# Patient Record
Sex: Male | Born: 1940 | Race: White | Hispanic: No | Marital: Married | Smoking: Former smoker
Health system: Southern US, Community
[De-identification: ages and names within clinical notes are randomized; demographics above are authoritative.]

## PROBLEM LIST (undated history)

## (undated) DIAGNOSIS — D509 Iron deficiency anemia, unspecified: Secondary | ICD-10-CM

## (undated) DIAGNOSIS — E785 Hyperlipidemia, unspecified: Secondary | ICD-10-CM

## (undated) DIAGNOSIS — J449 Chronic obstructive pulmonary disease, unspecified: Secondary | ICD-10-CM

## (undated) DIAGNOSIS — E119 Type 2 diabetes mellitus without complications: Secondary | ICD-10-CM

## (undated) DIAGNOSIS — N289 Disorder of kidney and ureter, unspecified: Secondary | ICD-10-CM

## (undated) DIAGNOSIS — Z77090 Contact with and (suspected) exposure to asbestos: Secondary | ICD-10-CM

## (undated) DIAGNOSIS — I10 Essential (primary) hypertension: Secondary | ICD-10-CM

## (undated) DIAGNOSIS — N529 Male erectile dysfunction, unspecified: Secondary | ICD-10-CM

## (undated) DIAGNOSIS — G4733 Obstructive sleep apnea (adult) (pediatric): Secondary | ICD-10-CM

## (undated) DIAGNOSIS — M0609 Rheumatoid arthritis without rheumatoid factor, multiple sites: Secondary | ICD-10-CM

## (undated) DIAGNOSIS — M1990 Unspecified osteoarthritis, unspecified site: Secondary | ICD-10-CM

## (undated) HISTORY — DX: Hyperlipidemia, unspecified: E78.5

## (undated) HISTORY — DX: Contact with and (suspected) exposure to asbestos: Z77.090

## (undated) HISTORY — PX: CYSTECTOMY: SUR359

## (undated) HISTORY — DX: Male erectile dysfunction, unspecified: N52.9

## (undated) HISTORY — PX: APPENDECTOMY: SHX54

## (undated) HISTORY — PX: HERNIA REPAIR: SHX51

## (undated) HISTORY — PX: UPPER GI ENDOSCOPY: SHX6162

## (undated) HISTORY — DX: Obstructive sleep apnea (adult) (pediatric): G47.33

## (undated) HISTORY — DX: Chronic obstructive pulmonary disease, unspecified: J44.9

## (undated) HISTORY — PX: KNEE ARTHROSCOPY: SUR90

## (undated) HISTORY — PX: MANDIBLE FRACTURE SURGERY: SHX706

## (undated) HISTORY — DX: Rheumatoid arthritis without rheumatoid factor, multiple sites: M06.09

## (undated) HISTORY — DX: Iron deficiency anemia, unspecified: D50.9

## (undated) HISTORY — PX: COLONOSCOPY: SHX174

---

## 2006-01-10 HISTORY — PX: JOINT REPLACEMENT: SHX530

## 2010-07-23 DIAGNOSIS — E785 Hyperlipidemia, unspecified: Secondary | ICD-10-CM | POA: Insufficient documentation

## 2010-07-23 DIAGNOSIS — N529 Male erectile dysfunction, unspecified: Secondary | ICD-10-CM | POA: Insufficient documentation

## 2010-07-23 DIAGNOSIS — E119 Type 2 diabetes mellitus without complications: Secondary | ICD-10-CM | POA: Insufficient documentation

## 2010-07-23 DIAGNOSIS — E669 Obesity, unspecified: Secondary | ICD-10-CM | POA: Insufficient documentation

## 2010-07-23 DIAGNOSIS — I10 Essential (primary) hypertension: Secondary | ICD-10-CM | POA: Insufficient documentation

## 2010-07-23 DIAGNOSIS — M0609 Rheumatoid arthritis without rheumatoid factor, multiple sites: Secondary | ICD-10-CM | POA: Insufficient documentation

## 2016-05-26 DIAGNOSIS — Z77090 Contact with and (suspected) exposure to asbestos: Secondary | ICD-10-CM | POA: Insufficient documentation

## 2016-11-24 DIAGNOSIS — J449 Chronic obstructive pulmonary disease, unspecified: Secondary | ICD-10-CM | POA: Insufficient documentation

## 2016-11-24 DIAGNOSIS — J948 Other specified pleural conditions: Secondary | ICD-10-CM | POA: Insufficient documentation

## 2017-06-01 ENCOUNTER — Other Ambulatory Visit: Payer: Self-pay

## 2017-06-01 ENCOUNTER — Emergency Department: Payer: Medicare Other

## 2017-06-01 ENCOUNTER — Emergency Department
Admission: EM | Admit: 2017-06-01 | Discharge: 2017-06-01 | Disposition: A | Discharge status: Home / Self Care | Payer: Medicare Other | Attending: Emergency Medicine | Admitting: Emergency Medicine

## 2017-06-01 DIAGNOSIS — Z794 Long term (current) use of insulin: Secondary | ICD-10-CM | POA: Insufficient documentation

## 2017-06-01 DIAGNOSIS — Z79899 Other long term (current) drug therapy: Secondary | ICD-10-CM | POA: Insufficient documentation

## 2017-06-01 DIAGNOSIS — M25551 Pain in right hip: Secondary | ICD-10-CM

## 2017-06-01 DIAGNOSIS — Z87891 Personal history of nicotine dependence: Secondary | ICD-10-CM | POA: Diagnosis not present

## 2017-06-01 DIAGNOSIS — M79661 Pain in right lower leg: Secondary | ICD-10-CM

## 2017-06-01 DIAGNOSIS — I10 Essential (primary) hypertension: Secondary | ICD-10-CM | POA: Insufficient documentation

## 2017-06-01 DIAGNOSIS — E119 Type 2 diabetes mellitus without complications: Secondary | ICD-10-CM | POA: Diagnosis not present

## 2017-06-01 DIAGNOSIS — Z7982 Long term (current) use of aspirin: Secondary | ICD-10-CM | POA: Diagnosis not present

## 2017-06-01 HISTORY — DX: Type 2 diabetes mellitus without complications: E11.9

## 2017-06-01 HISTORY — DX: Unspecified osteoarthritis, unspecified site: M19.90

## 2017-06-01 HISTORY — DX: Essential (primary) hypertension: I10

## 2017-06-01 HISTORY — DX: Disorder of kidney and ureter, unspecified: N28.9

## 2017-06-01 MED ORDER — ONDANSETRON 4 MG PO TBDP
4.0000 mg | ORAL_TABLET | Freq: Once | ORAL | Status: AC
  Administered 2017-06-01: 4 mg via ORAL
  Filled 2017-06-01: qty 1

## 2017-06-01 MED ORDER — ONDANSETRON HCL 4 MG PO TABS: 4.0000 mg | ORAL_TABLET | Freq: Every day | ORAL | 0 refills | Status: AC | PRN

## 2017-06-01 MED ORDER — OXYCODONE HCL 5 MG PO TABS
5.0000 mg | ORAL_TABLET | Freq: Once | ORAL | Status: AC
  Administered 2017-06-01: 5 mg via ORAL
  Filled 2017-06-01: qty 1

## 2017-06-01 NOTE — Discharge Instructions (Addendum)
Follow-up with your primary care provider if any continued problems.  Take Zofran 10 to 15 minutes prior to taking your pain medication if needed.  You may also use ice or heat to your right leg as needed for discomfort.  X-rays today are negative for any acute bony injury.  It does show degenerative changes to your joints.

## 2017-06-01 NOTE — ED Notes (Signed)
See triage note  Presents with pain to right hip/leg  States he was placing post for tomato plants and developed pain   Pain radiates from hip into leg

## 2017-06-01 NOTE — ED Triage Notes (Signed)
Pt c/o right hip pain since yesterday, denies injury. States he has a hx of RA.

## 2017-06-01 NOTE — ED Provider Notes (Signed)
[facility] Emergency Department Provider Note  ___________________________________________   First MD Initiated Contact with Patient 06/01/17 [phone]     (approximate)  I have reviewed the triage vital signs and the nursing notes.   HISTORY  Chief Complaint Hip Pain  HPI Samuel Mendoza is a 77 y.o. male is here with complaint of right hip pain since yesterday.  Patient denies any known injury.  He states that he was out doing yard work but denies any direct trauma.  Patient does have a history of rheumatoid arthritis.  He states that he was going to take a oxycodone at home but became nauseous.  He denies any back pain or paresthesias into his right lower leg.  Patient his pain is a 10/10 at present.   Past Medical History:  Diagnosis Date  . Arthritis   . Diabetes mellitus without complication ([place])   . Hypertension   . Renal disorder     There are no active problems to display for this patient.   Prior to Admission medications   Medication Sig Start Date End Date Taking? Authorizing Provider  aspirin 325 MG EC tablet Take 325 mg by mouth daily.   Yes [provider]  glipiZIDE (GLUCOTROL) 5 MG tablet Take by mouth daily before breakfast.   Yes [provider]  insulin glargine (LANTUS) 100 UNIT/ML injection Inject into the skin at bedtime.   Yes [provider]  linagliptin (TRADJENTA) 5 MG TABS tablet Take 5 mg by mouth daily.   Yes [provider]  metFORMIN (GLUCOPHAGE) 500 MG tablet Take 500 mg by mouth 2 (two) times daily with a meal.   Yes [provider]  predniSONE (DELTASONE) 2.5 MG tablet Take 5 mg by mouth daily with breakfast.   Yes [provider]  ramipril (ALTACE) 10 MG capsule Take 10 mg by mouth daily.   Yes [provider]  sildenafil (REVATIO) 20 MG tablet Take 20 mg by mouth 3 (three) times daily.   Yes [provider]  simvastatin (ZOCOR) 80 MG tablet Take 80  mg by mouth daily.   Yes [provider]  ondansetron (ZOFRAN) 4 MG tablet Take 1 tablet (4 mg total) by mouth daily as needed for nausea or vomiting. 06/01/17 06/01/18  Johnn Hai, PA-C    Allergies Patient has no known allergies.  No family history on file.  Social History Social History   Tobacco Use  . Smoking status: Former Research scientist (life sciences)  . Smokeless tobacco: Never Used  Substance Use Topics  . Alcohol use: Not on file  . Drug use: Not on file    Review of Systems Constitutional: No fever/chills Cardiovascular: Denies chest pain. Respiratory: Denies shortness of breath. Gastrointestinal: No abdominal pain.  Positive nausea, no vomiting.  Genitourinary: Negative for dysuria. Musculoskeletal: Negative for back pain.  Positive for right hip pain. Skin: Negative for rash. Neurological: Negative for headaches, focal weakness or numbness. ___________________________________________   PHYSICAL EXAM:  VITAL SIGNS: ED Triage Vitals  Enc Vitals Group     BP 06/01/17 0929 (!) 141/51     Pulse Rate 06/01/17 0929 81     Resp 06/01/17 0929 16     Temp 06/01/17 0929 98.2 F (36.8 C)     Temp Source 06/01/17 0929 Oral     SpO2 06/01/17 0929 96 %     Weight 06/01/17 0926 230 lb (104.3 kg)     Height 06/01/17 0926 6' (1.829 m)  Head Circumference --      Peak Flow --      Pain Score 06/01/17 0926 10     Pain Loc --      Pain Edu? --      Excl. in [place]? --    Constitutional: Alert and oriented. Well appearing and in no acute distress. Eyes: Conjunctivae are normal.  Head: Atraumatic. Neck: No stridor.   Cardiovascular: Normal rate, regular rhythm. Grossly normal heart sounds.  Good peripheral circulation. Respiratory: Normal respiratory effort.  No retractions. Lungs CTAB. Gastrointestinal: Soft and nontender. No distention.  Musculoskeletal: On examination of right hip there is marked tenderness on palpation both on lateral and posterior aspects.  Range of  motion is restricted secondary to pain.  No ecchymosis or abrasions are noted.  On examination of the right knee there is well-healed surgical scars.  No effusion, erythema, ecchymosis or abrasions noted.  Skin is intact. Neurologic:  Normal speech and language. No gross focal neurologic deficits are appreciated.  Skin:  Skin is warm, dry and intact.  Psychiatric: Mood and affect are normal. Speech and behavior are normal.  ____________________________________________   LABS (all labs ordered are listed, but only abnormal results are displayed)  Labs Reviewed - No data to display RADIOLOGY   Official radiology report(s): Dg Pelvis 1-2 Views  Result Date: 06/01/2017 CLINICAL DATA:  Hip pain EXAM: PELVIS - 1-2 VIEW COMPARISON:  None. FINDINGS: Early joint space narrowing within the hip joints bilaterally. No acute bony abnormality. Specifically, no fracture, subluxation, or dislocation. Diffuse osteopenia. IMPRESSION: Early joint space narrowing bilaterally. Diffuse osteopenia. No acute bony abnormality. Electronically Signed   By: Rolm Baptise M.D.   On: 06/01/2017 11:03   Dg Femur Min 2 Views Right  Result Date: 06/01/2017 CLINICAL DATA:  Chest pain after gardening. EXAM: RIGHT FEMUR 2 VIEWS COMPARISON:  No prior. FINDINGS: Diffuse osteopenia degenerative change. No acute bony or joint abnormality identified. No evidence of fracture dislocation. Total right knee replacement. Peripheral vascular disease. IMPRESSION: 1. Degenerative changes right hip. Total right knee replacement. No acute bony abnormality. 2.  Peripheral vascular disease. Electronically Signed   By: Marcello Moores  Register   On: 06/01/2017 11:05    ____________________________________________   PROCEDURES  Procedure(s) performed: None  Procedures  Critical Care performed: No  ____________________________________________   INITIAL IMPRESSION / ASSESSMENT AND PLAN / ED COURSE  As part of my medical decision making, I  reviewed the following data within the electronic MEDICAL RECORD NUMBER Notes from prior ED visits and [place] Controlled Substance Database  Patient was given Zofran ODT while in the department along with a oxycodone immediate release 5 mg and had no further complaint of nausea.  Pain improved.  Patient was made aware that x-rays did not show any new bony abnormality.  He is to follow-up with his PCP if any continued problems.  ____________________________________________   FINAL CLINICAL IMPRESSION(S) / ED DIAGNOSES  Final diagnoses:  Right hip pain  Pain in right lower leg     ED Discharge Orders        Ordered    ondansetron (ZOFRAN) 4 MG tablet  Daily PRN     06/01/17 1139       Note:  This document was prepared using Dragon voice recognition software and may include unintentional dictation errors.    Johnn Hai, PA-C 06/01/17 1753    Earleen Newport, MD 06/02/17 [phone]

## 2018-02-28 ENCOUNTER — Emergency Department
Admission: EM | Admit: 2018-02-28 | Discharge: 2018-02-28 | Disposition: A | Discharge status: Home / Self Care | Payer: Medicare Other | Attending: Emergency Medicine | Admitting: Emergency Medicine

## 2018-02-28 ENCOUNTER — Encounter: Payer: Self-pay | Admitting: Emergency Medicine

## 2018-02-28 ENCOUNTER — Other Ambulatory Visit: Payer: Self-pay

## 2018-02-28 DIAGNOSIS — I10 Essential (primary) hypertension: Secondary | ICD-10-CM | POA: Insufficient documentation

## 2018-02-28 DIAGNOSIS — M79675 Pain in left toe(s): Secondary | ICD-10-CM | POA: Diagnosis present

## 2018-02-28 DIAGNOSIS — E119 Type 2 diabetes mellitus without complications: Secondary | ICD-10-CM | POA: Insufficient documentation

## 2018-02-28 DIAGNOSIS — Z87891 Personal history of nicotine dependence: Secondary | ICD-10-CM | POA: Insufficient documentation

## 2018-02-28 DIAGNOSIS — Z79899 Other long term (current) drug therapy: Secondary | ICD-10-CM | POA: Diagnosis not present

## 2018-02-28 DIAGNOSIS — Z7982 Long term (current) use of aspirin: Secondary | ICD-10-CM | POA: Insufficient documentation

## 2018-02-28 DIAGNOSIS — L03032 Cellulitis of left toe: Secondary | ICD-10-CM | POA: Diagnosis not present

## 2018-02-28 LAB — CBC WITH DIFFERENTIAL/PLATELET
ABS IMMATURE GRANULOCYTES: 0.03 10*3/uL (ref 0.00–0.07)
BASOS PCT: 0 %
Basophils Absolute: 0 10*3/uL (ref 0.0–0.1)
Eosinophils Absolute: 0.1 10*3/uL (ref 0.0–0.5)
Eosinophils Relative: 1 %
HCT: 37.6 % — ABNORMAL LOW (ref 39.0–52.0)
Hemoglobin: 11.9 g/dL — ABNORMAL LOW (ref 13.0–17.0)
IMMATURE GRANULOCYTES: 0 %
LYMPHS ABS: 1.3 10*3/uL (ref 0.7–4.0)
Lymphocytes Relative: 16 %
MCH: 25.9 pg — ABNORMAL LOW (ref 26.0–34.0)
MCHC: 31.6 g/dL (ref 30.0–36.0)
MCV: 81.9 fL (ref 80.0–100.0)
MONOS PCT: 7 %
Monocytes Absolute: 0.6 10*3/uL (ref 0.1–1.0)
NEUTROS ABS: 6 10*3/uL (ref 1.7–7.7)
NEUTROS PCT: 76 %
PLATELETS: 203 10*3/uL (ref 150–400)
RBC: 4.59 MIL/uL (ref 4.22–5.81)
RDW: 13.9 % (ref 11.5–15.5)
WBC: 7.9 10*3/uL (ref 4.0–10.5)
nRBC: 0 % (ref 0.0–0.2)

## 2018-02-28 LAB — COMPREHENSIVE METABOLIC PANEL
ALT: 16 U/L (ref 0–44)
AST: 21 U/L (ref 15–41)
Albumin: 3.7 g/dL (ref 3.5–5.0)
Alkaline Phosphatase: 46 U/L (ref 38–126)
Anion gap: 9 (ref 5–15)
BUN: 12 mg/dL (ref 8–23)
CHLORIDE: 102 mmol/L (ref 98–111)
CO2: 25 mmol/L (ref 22–32)
CREATININE: 0.76 mg/dL (ref 0.61–1.24)
Calcium: 9 mg/dL (ref 8.9–10.3)
GFR calc Af Amer: >60 mL/min (ref >60)
Glucose, Bld: 220 mg/dL — ABNORMAL HIGH (ref 70–99)
Potassium: 4.1 mmol/L (ref 3.5–5.1)
Sodium: 136 mmol/L (ref 135–145)
Total Bilirubin: 0.8 mg/dL (ref 0.3–1.2)
Total Protein: 6.9 g/dL (ref 6.5–8.1)

## 2018-02-28 MED ORDER — CEFTRIAXONE SODIUM 1 G IJ SOLR
1.0000 g | Freq: Once | INTRAMUSCULAR | Status: AC
  Administered 2018-02-28: 1 g via INTRAVENOUS
  Filled 2018-02-28 (×2): qty 10

## 2018-02-28 MED ORDER — CEPHALEXIN 500 MG PO CAPS: 500.0000 mg | ORAL_CAPSULE | Freq: Four times a day (QID) | ORAL | 0 refills | Status: AC

## 2018-02-28 NOTE — ED Provider Notes (Signed)
[facility] Emergency Department Provider Note ____________________________________________   First MD Initiated Contact with Patient 02/28/18 1222     (approximate)  I have reviewed the triage vital signs and the nursing notes.   HISTORY  Chief Complaint Foot Pain    HPI Samuel Mendoza is a 78 y.o. male with PMH as noted below who presents with pain and swelling to the left fourth toe over the last few days, gradual onset, and now associated with some redness spreading to the foot.  The patient states that 2 weeks ago he had his nails trimmed at the podiatrist office.  He had no symptoms after that until these last few days.  He denies pain going up the leg.  He denies any fever or chills or other acute symptoms.  He has had no trauma to the foot.  Past Medical History:  Diagnosis Date  . Arthritis   . Diabetes mellitus without complication ([place])   . Hypertension   . Renal disorder     There are no active problems to display for this patient.   History reviewed. No pertinent surgical history.  Prior to Admission medications   Medication Sig Start Date End Date Taking? Authorizing Provider  aspirin 325 MG EC tablet Take 325 mg by mouth daily.    [provider]  glipiZIDE (GLUCOTROL) 5 MG tablet Take by mouth daily before breakfast.    [provider]  insulin glargine (LANTUS) 100 UNIT/ML injection Inject into the skin at bedtime.    [provider]  linagliptin (TRADJENTA) 5 MG TABS tablet Take 5 mg by mouth daily.    [provider]  metFORMIN (GLUCOPHAGE) 500 MG tablet Take 500 mg by mouth 2 (two) times daily with a meal.    [provider]  ondansetron (ZOFRAN) 4 MG tablet Take 1 tablet (4 mg total) by mouth daily as needed for nausea or vomiting. 06/01/17 06/01/18  Johnn Hai, PA-C  predniSONE (DELTASONE) 2.5 MG tablet Take 5 mg by mouth daily with breakfast.    [provider]    ramipril (ALTACE) 10 MG capsule Take 10 mg by mouth daily.    [provider]  sildenafil (REVATIO) 20 MG tablet Take 20 mg by mouth 3 (three) times daily.    [provider]  simvastatin (ZOCOR) 80 MG tablet Take 80 mg by mouth daily.    [provider]    Allergies Patient has no known allergies.  No family history on file.  Social History Social History   Tobacco Use  . Smoking status: Former Research scientist (life sciences)  . Smokeless tobacco: Never Used  Substance Use Topics  . Alcohol use: Not Currently  . Drug use: Never    Review of Systems  Constitutional: No fever/chills Eyes: No redness. ENT: No sore throat. Cardiovascular: Denies chest pain. Respiratory: Denies shortness of breath. Gastrointestinal: No vomiting or diarrhea. Genitourinary: Negative for polyuria.  Musculoskeletal: Negative for back pain. Skin: Negative for rash. Neurological: Negative for focal weakness or numbness.   ____________________________________________   PHYSICAL EXAM:  VITAL SIGNS: ED Triage Vitals  Enc Vitals Group     BP 02/28/18 0812 (!) 162/58     Pulse Rate 02/28/18 0812 (!) 108     Resp 02/28/18 0812 16     Temp 02/28/18 0812 98.4 F (36.9 C)     Temp Source 02/28/18 0812 Oral     SpO2 02/28/18 0812 98 %     Weight 02/28/18 0812 235  lb (106.6 kg)     Height 02/28/18 0812 6' (1.829 m)     Head Circumference --      Peak Flow --      Pain Score 02/28/18 0817 8     Pain Loc --      Pain Edu? --      Excl. in [place]? --     Constitutional: Alert and oriented. Well appearing and in no acute distress. Eyes: Conjunctivae are normal.  Head: Atraumatic. Nose: No congestion/rhinnorhea. Mouth/Throat: Mucous membranes are moist.   Neck: Normal range of motion.  Cardiovascular: Good peripheral circulation. Respiratory: Normal respiratory effort.   Gastrointestinal: No distention.  Musculoskeletal: No lower extremity edema.  Extremities warm and well perfused.  Left  fourth toe with erythema and induration dorsally with minimal swelling.  Faint erythematous streak up the dorsal aspect of the foot.  No erythema, streaking, induration, or abnormal warmth to the ankle or leg.  No open wound or ulcer.  Patient able to move the toe. Neurologic:  Normal speech and language. No gross focal neurologic deficits are appreciated.  Skin:  Skin is warm and dry.  Psychiatric: Mood and affect are normal. Speech and behavior are normal.  ____________________________________________   LABS (all labs ordered are listed, but only abnormal results are displayed)  Labs Reviewed  CBC WITH DIFFERENTIAL/PLATELET - Abnormal; Notable for the following components:      Result Value   Hemoglobin 11.9 (*)    HCT 37.6 (*)    MCH 25.9 (*)    All other components within normal limits  COMPREHENSIVE METABOLIC PANEL - Abnormal; Notable for the following components:   Glucose, Bld 220 (*)    All other components within normal limits   ____________________________________________  EKG   ____________________________________________  RADIOLOGY    ____________________________________________   PROCEDURES  Procedure(s) performed: No  Procedures  Critical Care performed: No ____________________________________________   INITIAL IMPRESSION / ASSESSMENT AND PLAN / ED COURSE  Pertinent labs & imaging results that were available during my care of the patient were reviewed by me and considered in my medical decision making (see chart for details).  78 year old male with PMH as noted above including history of relatively well-controlled diabetes presents with erythema and swelling to his left fourth toe.  The patient had his nails trimmed 2 weeks ago and has had no trauma since.   He has no fever or systemic symptoms.on exam, his vital signs are normal.  He is overall well-appearing.  He has very faint erythema going up to the dorsum of the foot but nothing proximal to this.   He has good range of motion at the toe, foot, and ankle.  Overall presentation is consistent with cellulitis.  The patient has no history of frequent skin infections or MRSA, and the infection does not appear purulent.  His diabetes is relatively well controlled.  Therefore we will treat with standard coverage for skin flora.  At this time, based on his lack of systemic symptoms, normal vital signs, and reassuring labs, there is no indication for inpatient admission.  There is also no indication for imaging at this time.  There is no open wound or evidence of osteomyelitis, and no trauma.  I will give a dose of ceftriaxone in the ED and Keflex for home.  The patient is following up with the podiatrist tomorrow.  I discussed the results of the work-up with the patient and his family members.  They agree with the plan.  Return  precautions given, and he expresses understanding.  ____________________________________________   FINAL CLINICAL IMPRESSION(S) / ED DIAGNOSES  Final diagnoses:  Cellulitis of fourth toe of left foot      NEW MEDICATIONS STARTED DURING THIS VISIT:  New Prescriptions   No medications on file     Note:  This document was prepared using Dragon voice recognition software and may include unintentional dictation errors.    Arta Silence, MD 02/28/18 1312

## 2018-02-28 NOTE — ED Notes (Signed)
First Nurse Note: Patient states he is diabetic and has pain and swelling of toe on left foot.

## 2018-02-28 NOTE — ED Notes (Signed)
First Nurse Note: CBC and CMP results reviewed.

## 2018-02-28 NOTE — ED Notes (Signed)
Spoke with Dr. Joni Fears regarding patient complaint, orders obtained for CBC and CMP.

## 2018-02-28 NOTE — ED Triage Notes (Signed)
Patient states he is diabetic X 15 years, takes PO meds and insulin.  States his FSBS was 12 at home this AM and that he has been taking his meds this AM.  States he has had redness and swelling of the left foot toe proximal to pinky toe since having his nails cut by Dr. Trellis Paganini - has appt there tomorrow.  The toe is reddened and redness is spreading up the top of his foot.  States the pain kept him awake last night.

## 2018-02-28 NOTE — ED Notes (Signed)
First Nurse Note: Patient rechecked in [place], VSS, no new complaints.

## 2018-02-28 NOTE — Discharge Instructions (Signed)
Take the antibiotic as prescribed starting tonight at home, and finish the full course.  Follow-up with the podiatrist tomorrow as planned.  Return to the ER immediately for new, worsening, or persistent redness or swelling, redness spreading up the foot or leg, fever or chills, vomiting or inability to take the medication, or any other new or worsening symptoms that concern you.

## 2018-04-11 ENCOUNTER — Other Ambulatory Visit: Payer: Self-pay | Admitting: Podiatry

## 2018-04-12 ENCOUNTER — Inpatient Hospital Stay: Admission: RE | Admit: 2018-04-12 | Payer: PRIVATE HEALTH INSURANCE | Source: Ambulatory Visit

## 2018-04-13 ENCOUNTER — Encounter
Admission: RE | Admit: 2018-04-13 | Discharge: 2018-04-13 | Disposition: A | Discharge status: Home / Self Care | Payer: Medicare Other | Source: Ambulatory Visit | Attending: Podiatry | Admitting: Podiatry

## 2018-04-13 ENCOUNTER — Other Ambulatory Visit: Payer: Self-pay

## 2018-04-13 DIAGNOSIS — Z01812 Encounter for preprocedural laboratory examination: Secondary | ICD-10-CM | POA: Insufficient documentation

## 2018-04-13 HISTORY — DX: Chronic obstructive pulmonary disease, unspecified: J44.9

## 2018-04-13 NOTE — Patient Instructions (Signed)
  Your procedure is scheduled on : Friday April 20, 2018 Report to Same Day Surgery 2nd floor [facility] Entrance-take elevator on left to 2nd floor.  Check in with surgery information desk.) To find out your arrival time, call [phone] 1:00-3:00 PM on Thursday April 19, 2018  Remember: Instructions that are not followed completely may result in serious medical risk, up to and including death, or upon the discretion of your surgeon and anesthesiologist your surgery may need to be rescheduled.    __x__ 1. Do not eat food (including mints, candies, chewing gum) after midnight the night before your procedure. You may drink clear liquids up to 2 hours before you are scheduled to arrive at the hospital for your procedure.  Do not drink anything within 2 hours of your scheduled arrival to the hospital.  Approved clear liquids:  --Water  --Clear carbohydrate beverage such as G2- finish within 2 hours before arrival    __x__ 2. No Alcohol for 24 hours before or after surgery.   __x__ 3. No Smoking or e-cigarettes for 24 hours before surgery.  Do not use any chewable tobacco products for at least 6 hours before surgery.   __x__ 4. Notify your doctor if there is any change in your medical condition (cold, fever, infections).   __x__ 5. On the morning of surgery brush your teeth with toothpaste and water.  You may rinse your mouth with mouthwash if you wish.  Do not swallow any toothpaste or mouthwash.  Please read over the following fact sheets that you were given:   [facility] Preparing for Surgery and/or MRSA Information    __x__ Use CHG Soap as directed on instruction sheet   Do not wear jewelry on the day of surgery.  Do not wear lotions, powders, deodorant, or perfumes.   Do not shave below the face/neck 48 hours prior to surgery.   Do not bring valuables to the hospital.    [facility] is not responsible for any belongings or valuables.               Glasses,  dentures or bridgework may not be worn into surgery.  For patients discharged on the day of surgery, you will NOT be permitted to drive yourself home.  You must have a responsible adult with you for 24 hours after surgery.  __x__ Take these medicines on the morning of surgery with a SMALL SIP OF WATER:  1. Oxycodone/Percocet if needed  Skip your Ramipril and Glipizide on the morning of surgery.  No need to stop ahead of time.  __x__ Use inhalers on the day of surgery and bring them with you to the hospital.  __x__ Stop Metformin 2 days before surgery (Last dose Tuesday April 17, 2018).    __x__ Take 1/2 of usual insulin dose the night before surgery (19 units of Lantus instead of your normal 38units)  __x__ Follow recommendations from Cardiologist, Pulmonologist or PCP regarding stopping Aspirin, Coumadin, Plavix, Eliquis, Effient, Pradaxa, and Pletal.  __x__ TODAY: Stop Anti-inflammatories such as Advil, Ibuprofen, Motrin, Aleve, Naproxen, Naprosyn, BC/Goodies powders or aspirin products. You may continue to take Tylenol and Celebrex.   __x__ TODAY: Stop Fish Oil supplements until after surgery. You may continue to take Vitamin D, Vitamin B, and multivitamin.

## 2018-04-19 DIAGNOSIS — E782 Mixed hyperlipidemia: Secondary | ICD-10-CM | POA: Insufficient documentation

## 2018-04-19 MED ORDER — CEFAZOLIN SODIUM-DEXTROSE 2-4 GM/100ML-% IV SOLN
2.0000 g | INTRAVENOUS | Status: AC
  Administered 2018-04-20: 2 g via INTRAVENOUS

## 2018-04-20 ENCOUNTER — Ambulatory Visit: Payer: Medicare Other | Admitting: Registered Nurse

## 2018-04-20 ENCOUNTER — Encounter
Admission: RE | Disposition: A | Discharge status: Home / Self Care | Payer: Self-pay | Source: Ambulatory Visit | Attending: Podiatry

## 2018-04-20 ENCOUNTER — Other Ambulatory Visit: Payer: Self-pay

## 2018-04-20 ENCOUNTER — Encounter: Payer: Self-pay | Admitting: *Deleted

## 2018-04-20 ENCOUNTER — Ambulatory Visit
Admission: RE | Admit: 2018-04-20 | Discharge: 2018-04-20 | Disposition: A | Discharge status: Home / Self Care | Payer: Medicare Other | Source: Ambulatory Visit | Attending: Podiatry | Admitting: Podiatry

## 2018-04-20 DIAGNOSIS — E1169 Type 2 diabetes mellitus with other specified complication: Secondary | ICD-10-CM | POA: Diagnosis not present

## 2018-04-20 DIAGNOSIS — J449 Chronic obstructive pulmonary disease, unspecified: Secondary | ICD-10-CM | POA: Insufficient documentation

## 2018-04-20 DIAGNOSIS — M2041 Other hammer toe(s) (acquired), right foot: Secondary | ICD-10-CM | POA: Insufficient documentation

## 2018-04-20 DIAGNOSIS — E119 Type 2 diabetes mellitus without complications: Secondary | ICD-10-CM | POA: Diagnosis not present

## 2018-04-20 DIAGNOSIS — N289 Disorder of kidney and ureter, unspecified: Secondary | ICD-10-CM | POA: Diagnosis not present

## 2018-04-20 DIAGNOSIS — M869 Osteomyelitis, unspecified: Secondary | ICD-10-CM | POA: Insufficient documentation

## 2018-04-20 DIAGNOSIS — I10 Essential (primary) hypertension: Secondary | ICD-10-CM | POA: Diagnosis not present

## 2018-04-20 DIAGNOSIS — M1990 Unspecified osteoarthritis, unspecified site: Secondary | ICD-10-CM | POA: Insufficient documentation

## 2018-04-20 DIAGNOSIS — Z87891 Personal history of nicotine dependence: Secondary | ICD-10-CM | POA: Diagnosis not present

## 2018-04-20 HISTORY — PX: AMPUTATION TOE: SHX6595

## 2018-04-20 LAB — GLUCOSE, CAPILLARY
Glucose-Capillary: 104 mg/dL — ABNORMAL HIGH (ref 70–99)
Glucose-Capillary: 126 mg/dL — ABNORMAL HIGH (ref 70–99)

## 2018-04-20 SURGERY — AMPUTATION, TOE
Anesthesia: General | Site: Toe | Laterality: Right

## 2018-04-20 MED ORDER — HYDROCODONE-ACETAMINOPHEN 5-325 MG PO TABS: 1.0000 | ORAL_TABLET | ORAL | 0 refills | Status: AC | PRN

## 2018-04-20 MED ORDER — PROPOFOL 10 MG/ML IV BOLUS
INTRAVENOUS | Status: DC | PRN
  Administered 2018-04-20: 20 mg via INTRAVENOUS
  Administered 2018-04-20: 180 mg via INTRAVENOUS

## 2018-04-20 MED ORDER — PROPOFOL 10 MG/ML IV BOLUS
INTRAVENOUS | Status: AC
  Filled 2018-04-20: qty 20

## 2018-04-20 MED ORDER — GENTAMICIN SULFATE 40 MG/ML IJ SOLN
INTRAMUSCULAR | Status: AC
  Filled 2018-04-20: qty 2

## 2018-04-20 MED ORDER — ONDANSETRON HCL 4 MG/2ML IJ SOLN
INTRAMUSCULAR | Status: DC | PRN
  Administered 2018-04-20: 4 mg via INTRAVENOUS

## 2018-04-20 MED ORDER — LIDOCAINE HCL (CARDIAC) PF 100 MG/5ML IV SOSY
PREFILLED_SYRINGE | INTRAVENOUS | Status: DC | PRN
  Administered 2018-04-20: 100 mg via INTRAVENOUS

## 2018-04-20 MED ORDER — LIDOCAINE HCL (PF) 2 % IJ SOLN
INTRAMUSCULAR | Status: AC
  Filled 2018-04-20: qty 10

## 2018-04-20 MED ORDER — SUCCINYLCHOLINE CHLORIDE 20 MG/ML IJ SOLN
INTRAMUSCULAR | Status: AC
  Filled 2018-04-20: qty 1

## 2018-04-20 MED ORDER — FAMOTIDINE 20 MG PO TABS
ORAL_TABLET | ORAL | Status: AC
  Administered 2018-04-20: 07:00:00 20 mg via ORAL
  Filled 2018-04-20: qty 1

## 2018-04-20 MED ORDER — ONDANSETRON HCL 4 MG/2ML IJ SOLN: 4.0000 mg | Freq: Once | INTRAMUSCULAR | Status: DC | PRN

## 2018-04-20 MED ORDER — FENTANYL CITRATE (PF) 100 MCG/2ML IJ SOLN
INTRAMUSCULAR | Status: AC
  Filled 2018-04-20: qty 2

## 2018-04-20 MED ORDER — FAMOTIDINE 20 MG PO TABS
20.0000 mg | ORAL_TABLET | Freq: Once | ORAL | Status: AC
  Administered 2018-04-20: 07:00:00 20 mg via ORAL

## 2018-04-20 MED ORDER — CEFAZOLIN SODIUM-DEXTROSE 2-4 GM/100ML-% IV SOLN
INTRAVENOUS | Status: AC
  Filled 2018-04-20: qty 100

## 2018-04-20 MED ORDER — FENTANYL CITRATE (PF) 100 MCG/2ML IJ SOLN: 25.0000 ug | INTRAMUSCULAR | Status: DC | PRN

## 2018-04-20 MED ORDER — BUPIVACAINE HCL 0.5 % IJ SOLN
INTRAMUSCULAR | Status: DC | PRN
  Administered 2018-04-20: 10 mL

## 2018-04-20 MED ORDER — POVIDONE-IODINE 7.5 % EX SOLN
Freq: Once | CUTANEOUS | Status: DC
  Filled 2018-04-20: qty 118

## 2018-04-20 MED ORDER — SODIUM CHLORIDE 0.9 % IV SOLN
INTRAVENOUS | Status: DC
  Administered 2018-04-20: 07:00:00 via INTRAVENOUS

## 2018-04-20 MED ORDER — FENTANYL CITRATE (PF) 100 MCG/2ML IJ SOLN
INTRAMUSCULAR | Status: DC | PRN
  Administered 2018-04-20 (×4): 25 ug via INTRAVENOUS

## 2018-04-20 MED ORDER — BUPIVACAINE HCL (PF) 0.5 % IJ SOLN
INTRAMUSCULAR | Status: AC
  Filled 2018-04-20: qty 30

## 2018-04-20 MED ORDER — ONDANSETRON HCL 4 MG/2ML IJ SOLN
INTRAMUSCULAR | Status: AC
  Filled 2018-04-20: qty 2

## 2018-04-20 SURGICAL SUPPLY — 51 items
BANDAGE ACE 4X5 VEL STRL LF (GAUZE/BANDAGES/DRESSINGS) ×3 IMPLANT
BLADE MED AGGRESSIVE (BLADE) ×3 IMPLANT
BLADE OSC/SAGITTAL MD 5.5X18 (BLADE) ×3 IMPLANT
BLADE SURG 15 STRL LF DISP TIS (BLADE) ×2 IMPLANT
BLADE SURG 15 STRL SS (BLADE) ×4
BLADE SURG MINI STRL (BLADE) ×3 IMPLANT
BNDG CONFORM 2 STRL LF (GAUZE/BANDAGES/DRESSINGS) ×3 IMPLANT
BNDG CONFORM 6X.82 1P STRL (GAUZE/BANDAGES/DRESSINGS) ×2 IMPLANT
BNDG ESMARK 4X12 TAN STRL LF (GAUZE/BANDAGES/DRESSINGS) ×3 IMPLANT
BNDG GAUZE 4.5X4.1 6PLY STRL (MISCELLANEOUS) ×3 IMPLANT
CANISTER SUCT 1200ML W/VALVE (MISCELLANEOUS) ×3 IMPLANT
CLOSURE WOUND 1/4X4 (GAUZE/BANDAGES/DRESSINGS) ×1
CNTNR SPEC 2.5X3XGRAD LEK (MISCELLANEOUS) ×1
CONT SPEC 4OZ STER OR WHT (MISCELLANEOUS) ×2
CONTAINER SPEC 2.5X3XGRAD LEK (MISCELLANEOUS) IMPLANT
COVER WAND RF STERILE (DRAPES) ×1 IMPLANT
CUFF TOURN 18 STER (MISCELLANEOUS) ×3 IMPLANT
CUFF TOURN DUAL PL 12 NO SLV (MISCELLANEOUS) ×3 IMPLANT
DRAPE FLUOR MINI C-ARM 54X84 (DRAPES) ×3 IMPLANT
DURAPREP 26ML APPLICATOR (WOUND CARE) ×3 IMPLANT
ELECT REM PT RETURN 9FT ADLT (ELECTROSURGICAL) ×3
ELECTRODE REM PT RTRN 9FT ADLT (ELECTROSURGICAL) ×1 IMPLANT
GAUZE PETRO XEROFOAM 1X8 (MISCELLANEOUS) ×3 IMPLANT
GAUZE SPONGE 4X4 12PLY STRL (GAUZE/BANDAGES/DRESSINGS) ×3 IMPLANT
GLOVE BIO SURGEON STRL SZ7.5 (GLOVE) ×7 IMPLANT
GLOVE INDICATOR 8.0 STRL GRN (GLOVE) ×7 IMPLANT
GOWN STRL REUS W/ TWL LRG LVL3 (GOWN DISPOSABLE) ×2 IMPLANT
GOWN STRL REUS W/TWL LRG LVL3 (GOWN DISPOSABLE) ×6
HANDPIECE VERSAJET DEBRIDEMENT (MISCELLANEOUS) ×3 IMPLANT
KIT TURNOVER KIT A (KITS) ×3 IMPLANT
LABEL OR SOLS (LABEL) ×3 IMPLANT
NDL FILTER BLUNT 18X1 1/2 (NEEDLE) ×1 IMPLANT
NDL HYPO 25X1 1.5 SAFETY (NEEDLE) ×2 IMPLANT
NEEDLE FILTER BLUNT 18X 1/2SAF (NEEDLE) ×2
NEEDLE FILTER BLUNT 18X1 1/2 (NEEDLE) ×1 IMPLANT
NEEDLE HYPO 25X1 1.5 SAFETY (NEEDLE) ×6 IMPLANT
NS IRRIG 500ML POUR BTL (IV SOLUTION) ×3 IMPLANT
PACK EXTREMITY ARMC (MISCELLANEOUS) ×3 IMPLANT
SOL .9 NS 3000ML IRR  AL (IV SOLUTION) ×2
SOL .9 NS 3000ML IRR UROMATIC (IV SOLUTION) ×1 IMPLANT
SOL PREP PVP 2OZ (MISCELLANEOUS) ×3
SOLUTION PREP PVP 2OZ (MISCELLANEOUS) ×1 IMPLANT
STOCKINETTE STRL 6IN 960660 (GAUZE/BANDAGES/DRESSINGS) ×3 IMPLANT
STRIP CLOSURE SKIN 1/4X4 (GAUZE/BANDAGES/DRESSINGS) ×2 IMPLANT
SUT ETHILON 3-0 FS-10 30 BLK (SUTURE) ×3
SUT ETHILON 4-0 (SUTURE)
SUT ETHILON 4-0 FS2 18XMFL BLK (SUTURE)
SUTURE EHLN 3-0 FS-10 30 BLK (SUTURE) ×1 IMPLANT
SUTURE ETHLN 4-0 FS2 18XMF BLK (SUTURE) IMPLANT
SWAB DUAL CULTURE TRANS RED ST (MISCELLANEOUS) ×3 IMPLANT
SYR 10ML LL (SYRINGE) ×3 IMPLANT

## 2018-04-20 NOTE — Anesthesia Post-op Follow-up Note (Signed)
Anesthesia QCDR form completed.        

## 2018-04-20 NOTE — Transfer of Care (Signed)
Immediate Anesthesia Transfer of Care Note  Patient: Samuel Mendoza  Procedure(s) Performed: AMPUTATION TOE MPJ/RT 3RD TOE (Right Toe)  Patient Location: PACU  Anesthesia Type:General  Level of Consciousness: sedated  Airway & Oxygen Therapy: Patient Spontanous Breathing and Patient connected to face mask oxygen  Post-op Assessment: Report given to RN and Post -op Vital signs reviewed and stable  Post vital signs: Reviewed and stable  Last Vitals:  Vitals Value Taken Time  BP 134/59 04/20/2018  8:19 AM  Temp 36.4 C 04/20/2018  8:19 AM  Pulse 83 04/20/2018  8:19 AM  Resp 12 04/20/2018  8:19 AM  SpO2 98 % 04/20/2018  8:19 AM  Vitals shown include unvalidated device data.  Last Pain:  Vitals:   04/20/18 0819  TempSrc:   PainSc: 0-No pain         Complications: No apparent anesthesia complications

## 2018-04-20 NOTE — H&P (Signed)
History of present illness: This is a 78 year old male with chronic ulceration on his right third toe.  Has progressed to full-thickness with osteomyelitis and patient elects for amputation of the right fourth toe.  Objective: Pulses are palpable on the right foot.  Diminished sensation.  Full-thickness ulceration down to the level of exposed capsule on the right third toe.  Some chronic rigid hammertoe deformities are noted.  Impression: Chronic hammertoe with osteomyelitis third toe.  Plan: Previously discussed all risks and complications related to the surgery.  Medical history and physical in the chart was reviewed.  Cleared by cardiology.  Patient is stable for amputation of his right fifth toe.

## 2018-04-20 NOTE — Interval H&P Note (Signed)
History and Physical Interval Note:  04/20/2018 7:07 AM  Samuel Mendoza  has presented today for surgery, with the diagnosis of OSTEOMYELITIS RT FOOT M86.171.  The various methods of treatment have been discussed with the patient and family. After consideration of risks, benefits and other options for treatment, the patient has consented to  Procedure(s): AMPUTATION TOE MPJ/RT 3RD TOE (Right) as a surgical intervention.  The patient's history has been reviewed, patient examined, no change in status, stable for surgery.  I have reviewed the patient's chart and labs.  Questions were answered to the patient's satisfaction.     Durward Fortes

## 2018-04-20 NOTE — Anesthesia Preprocedure Evaluation (Signed)
Anesthesia Evaluation  Patient identified by MRN, date of birth, ID band Patient awake    Reviewed: Allergy & Precautions, H&P , NPO status , Patient's Chart, lab work & pertinent test results, reviewed documented beta blocker date and time   History of Anesthesia Complications Negative for: history of anesthetic complications  Airway Mallampati: III  TM Distance: >3 FB Neck ROM: full    Dental  (+) Dental Advidsory Given, Upper Dentures, Lower Dentures   Pulmonary neg shortness of breath, COPD,  COPD inhaler, neg recent URI, former smoker,           Cardiovascular Exercise Tolerance: Good hypertension, (-) angina(-) CAD, (-) Past MI, (-) Cardiac Stents and (-) CABG (-) dysrhythmias (-) Valvular Problems/Murmurs     Neuro/Psych negative neurological ROS  negative psych ROS   GI/Hepatic negative GI ROS, Neg liver ROS,   Endo/Other  diabetes  Renal/GU negative Renal ROS  negative genitourinary   Musculoskeletal   Abdominal   Peds  Hematology negative hematology ROS (+)   Anesthesia Other Findings Past Medical History: No date: Arthritis No date: COPD (chronic obstructive pulmonary disease) (HCC) No date: Diabetes mellitus without complication (HCC) No date: Hypertension No date: Renal disorder   Reproductive/Obstetrics negative OB ROS                             Anesthesia Physical Anesthesia Plan  ASA: III  Anesthesia Plan: General   Post-op Pain Management:    Induction: Intravenous  PONV Risk Score and Plan: 2 and Ondansetron, Dexamethasone and Treatment may vary due to age or medical condition  Airway Management Planned: LMA  Additional Equipment:   Intra-op Plan:   Post-operative Plan: Extubation in OR  Informed Consent: I have reviewed the patients History and Physical, chart, labs and discussed the procedure including the risks, benefits and alternatives for the  proposed anesthesia with the patient or authorized representative who has indicated his/her understanding and acceptance.     Dental Advisory Given  Plan Discussed with: Anesthesiologist, CRNA and Surgeon  Anesthesia Plan Comments:         Anesthesia Quick Evaluation

## 2018-04-20 NOTE — Discharge Instructions (Addendum)
[place] [facility] Monday  MORNING TO INQUIRE OF THE TIME OF YOUR April 15 APPOINTMENT.    AMBULATORY SURGERY  DISCHARGE INSTRUCTIONS   1) The drugs that you were given will stay in your system until tomorrow so for the next 24 hours you should not:  A) Drive an automobile B) Make any legal decisions C) Drink any alcoholic beverage   2) You may resume regular meals tomorrow.  Today it is better to start with liquids and gradually work up to solid foods.  You may eat anything you prefer, but it is better to start with liquids, then soup and crackers, and gradually work up to solid foods.   3) Please notify your doctor immediately if you have any unusual bleeding, trouble breathing, redness and pain at the surgery site, drainage, fever, or pain not relieved by medication.    4) Additional Instructions:        Please contact your physician with any problems or Same Day Surgery at [phone], Monday through Friday 6 am to 4 pm, or [place] [facility] number at [phone].   1.  Elevate right leg on 2 pillows.  2.  Keep the bandage on the right foot clean, dry, and do not remove.  3.  Sponge bathe only right lower extremity.  4.  Wear surgical shoe on the right foot whenever walking or standing with pressure only on the heel.  5.  Take 1 pain pill, Norco, every 4 hours only if needed for pain.

## 2018-04-20 NOTE — Anesthesia Procedure Notes (Signed)
Procedure Name: LMA Insertion Date/Time: 04/20/2018 7:37 AM Performed by: Hedda Slade, CRNA Pre-anesthesia Checklist: Patient identified, Patient being monitored, Timeout performed, Emergency Drugs available and Suction available Patient Re-evaluated:Patient Re-evaluated prior to induction Oxygen Delivery Method: Circle system utilized Preoxygenation: Pre-oxygenation with 100% oxygen Induction Type: IV induction Ventilation: Mask ventilation without difficulty LMA: LMA inserted LMA Size: 4.5 Tube type: Oral Number of attempts: 1 Placement Confirmation: positive ETCO2 and breath sounds checked- equal and bilateral Tube secured with: Tape Dental Injury: Teeth and Oropharynx as per pre-operative assessment

## 2018-04-20 NOTE — Anesthesia Postprocedure Evaluation (Signed)
Anesthesia Post Note  Patient: [place]  Procedure(s) Performed: AMPUTATION TOE MPJ/RT 3RD TOE (Right Toe)  Patient location during evaluation: PACU Anesthesia Type: General Level of consciousness: awake and alert Pain management: pain level controlled Vital Signs Assessment: post-procedure vital signs reviewed and stable Respiratory status: spontaneous breathing, nonlabored ventilation, respiratory function stable and patient connected to nasal cannula oxygen Cardiovascular status: blood pressure returned to baseline and stable Postop Assessment: no apparent nausea or vomiting Anesthetic complications: no     Last Vitals:  Vitals:   04/20/18 0858 04/20/18 0926  BP: 114/61 (!) 131/53  Pulse: 78 92  Resp: 16 16  Temp: 36.9 C   SpO2: 96% 98%    Last Pain:  Vitals:   04/20/18 0926  TempSrc:   PainSc: 0-No pain                 Martha Clan

## 2018-04-20 NOTE — Op Note (Signed)
Date of operation: 04/20/2018.  Surgeon: Durward Fortes D.P.M.  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  Postoperative diagnosis: Same.  Procedure: Amputation right third toe metatarsal phalangeal joint.  Anesthesia: LMA with local.  Hemostasis: Pneumatic tourniquet right ankle 250 mmHg.  Estimated blood loss: 5 cc.  Cultures: Bone culture proximal phalanx right third toe.  Pathology: Right third toe.  Complications: None apparent.  Operative indications: This is a 78 year old male with a chronic history of hammertoes on his feet.  More recently developed an ulceration on the third toe which progressed down to the level of exposed bone.  Decision was made for amputation for the infected bone on the right third toe.  Operative procedure: Patient was taken to the operating room and placed on the table in the supine position.  Following satisfactory LMA anesthesia the right foot was anesthetized with 10 cc of 0.5% bupivacaine plain around the third metatarsal.  Pneumatic tourniquet was applied at the level of the right ankle and the foot was prepped and draped in the usual sterile fashion.       Attention was then directed to the distal right foot where an elliptical incision was made coursing dorsal to plantar around the base of the third toe.  The incision was deepened via sharp and blunt dissection down to the level of the joint where the toe was disarticulated and removed in toto.  The wound was flushed with copious amounts of sterile saline and closed using 3-0 nylon vertical mattress and simple interrupted sutures.  Culture was taken of the head of the proximal phalanx.  Xeroform 4 x 4's con form applied to the right foot followed by release of the tourniquet.  Kerlix and an Ace wrap applied.  Patient tolerated the procedure and anesthesia well and was awakened and transported to the PACU with vital signs stable and in good condition.

## 2018-04-23 ENCOUNTER — Encounter: Payer: Self-pay | Admitting: Podiatry

## 2018-04-23 LAB — SURGICAL PATHOLOGY

## 2018-04-25 LAB — AEROBIC/ANAEROBIC CULTURE W GRAM STAIN (SURGICAL/DEEP WOUND)

## 2018-04-25 LAB — AEROBIC/ANAEROBIC CULTURE (SURGICAL/DEEP WOUND)

## 2018-04-30 ENCOUNTER — Encounter: Payer: Self-pay | Admitting: Podiatry

## 2018-05-28 ENCOUNTER — Ambulatory Visit: Admit: 2018-05-28 | Payer: PRIVATE HEALTH INSURANCE | Admitting: Gastroenterology

## 2018-05-28 SURGERY — COLONOSCOPY WITH PROPOFOL
Anesthesia: General

## 2018-06-30 IMAGING — CR DG PELVIS 1-2V
1 series · 1 of 1 positions shown · non-contrast
Comparison: None.

CLINICAL DATA: Hip pain

EXAM:
PELVIS - 1-2 VIEW

[pelvis ap]
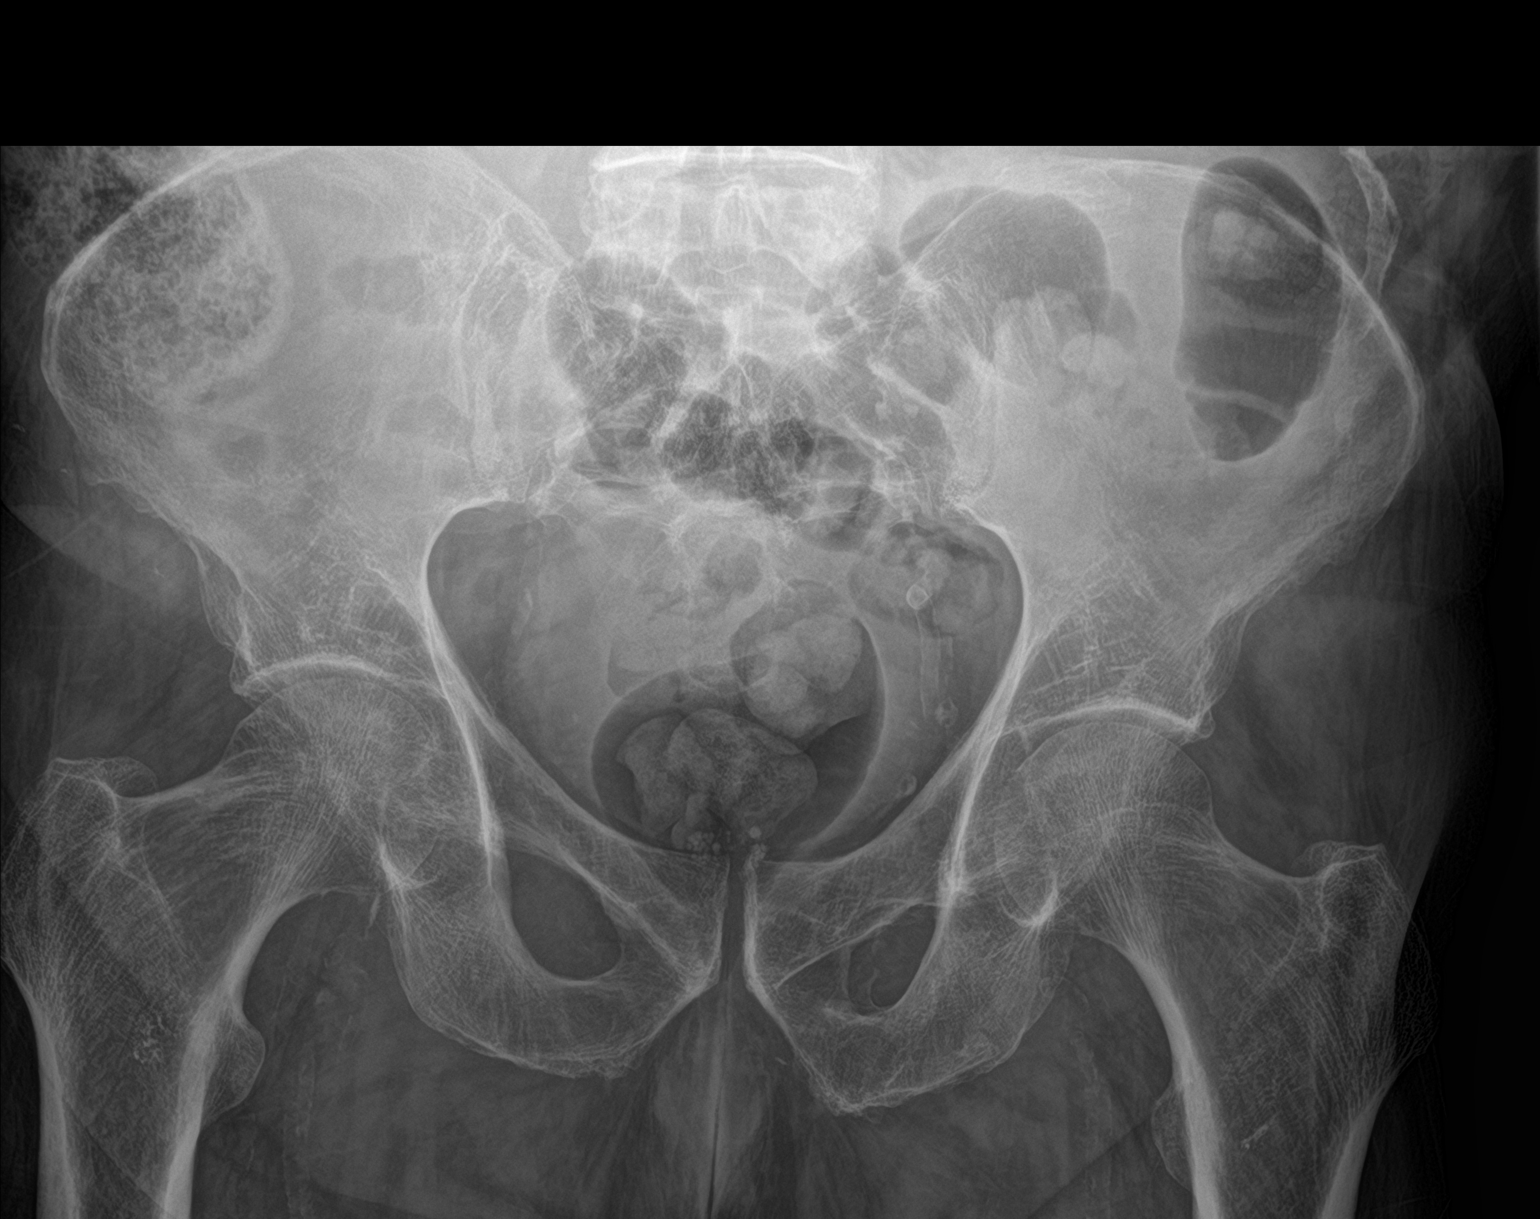

[1 of 1 positions shown; findings below may reference images not displayed]

FINDINGS: Early joint space narrowing within the hip joints bilaterally. No
acute bony abnormality. Specifically, no fracture, subluxation, or
dislocation. Diffuse osteopenia.
IMPRESSION: Early joint space narrowing bilaterally. Diffuse osteopenia. No
acute bony abnormality.

## 2020-04-03 ENCOUNTER — Encounter: Payer: Self-pay | Admitting: *Deleted

## 2020-04-07 DIAGNOSIS — D509 Iron deficiency anemia, unspecified: Secondary | ICD-10-CM | POA: Insufficient documentation

## 2020-04-07 NOTE — Progress Notes (Signed)
[facility]  [address], [address] [place], [place] [address] Phone: [phone]  Fax: [phone]   Clinic Day:  04/08/2020  Referring physician: Valera Castle, *  Chief Complaint: [place] M[place] is a 80 y.o. male with iron deficiency anemia who is referred in consultation by Dr. Johny Drilling for assessment and management.   HPI: The patient was initially seen by Dr. Cecille [place] at [facility] on 04/24/2014 for iron deficiency anemia. Microcytic anemia was first noted in 01/2014.  Colonoscopy on 04/17/2014 revealed 4 tubular adenomas.  He was taking oral iron inconsistently.  He denied any bleeding.  Plan was for iron dextran 1000 mg IV every 4 weeks x 2 (05/15/2014 and 06/12/2014).  The patient saw Dr. Kym Groom on 03/17/2020. Ferritin was 8 with an iron saturation of 5% and a TIBC of 524. Folate was 21.5. He was prescribed ferrous sulfate 325 mg a day. He was referred to hematology.  Labs followed: 04/24/2014: Hematocrit 35.0, hemoglobin   9.8, MCV 69.0, platelets 244,000, WBC 8,700. Ferritin     6. Iron saturation  3%. TIBC 529. 08/28/2014: Hematocrit 41.3, hemoglobin 13.6, MCV 90.0, platelets 152,000, WBC 8,300. Ferritin 144. Iron saturation 25%. TIBC 312. 12/25/2014: Hematocrit 43.4, hemoglobin 14.6, MCV 92.0, platelets 157,000, WBC 6,600. Ferritin   88. Iron saturation 23%. TIBC 325. 02/28/2018: Hematocrit 37.6, hemoglobin 11.9, MCV 81.9, platelets 203,000, WBC 7,900. 04/10/2018: Hematocrit 39.5, hemoglobin 12.5, MCV 79.0, platelets 232,000, WBC 8,500. 02/18/2020: Hematocrit 34.6, hemoglobin 10.0, MCV 70.0, platelets 298,000, WBC 8,000.  Ferritin has been followed: 6 on 04/24/2014, 144 on 08/28/2014, 88 on 12/25/2014, and 8 on 03/17/2020.  Symptomatically, he has been "alright." He stays sore due to rheumatoid arthritis and has been on [place] for several years. He has shortness of breath on exertion, occasional reflux, and psoriasis. He has 1-2  bowel movements per week.  He has been on 2 rounds of antibiotics recently for skin infections that he developed after having cysts removed.  The patient denies fevers, sweats, headaches, changes in vision, runny nose, sore throat, cough, chest pain, palpitations, nausea, vomiting, diarrhea, urinary symptoms, bone or joint symptoms, numbness, weakness, balance or coordination problem, and bleeding of any kind.  The patient eats meat and green leafy vegetables everyday. He started oral iron daily around 03/17/2020. His wife starting managing his medications a month ago.  Urinalysis on 02/18/2020 revealed no hematuria. Guaiac cards were negative.  He denies a family history of blood disorders and cancer.   Past [place] History:  Diagnosis Date  . Arthritis   . Asbestos exposure   . COPD (chronic obstructive pulmonary disease) ([place])   . COPD, moderate ([place])   . Diabetes mellitus without complication ([place])   . Dyslipidemia   . ED (erectile dysfunction)   . Hypertension   . IDA (iron deficiency anemia)   . Obstructive sleep apnea   . Renal disorder   . Rheumatoid arthritis of multiple sites with negative rheumatoid factor [place] [facility])     Past Surgical History:  Procedure Laterality Date  . AMPUTATION TOE Right 04/20/2018   Procedure: AMPUTATION TOE MPJ/RT 3RD TOE;  Surgeon: Sharlotte Alamo, DPM;  Location: [facility] ORS;  Service: Podiatry;  Laterality: Right;  . JOINT REPLACEMENT Right 2008    History reviewed. No pertinent family history.  Social History:  reports that he has quit smoking. His smoking use included cigarettes. He smoked 1.00 pack per day. He has never used smokeless tobacco. He reports previous alcohol use. He reports that he  does not use drugs. He smoked half a pack per day for about 35 years and quit 30 years ago. 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 His wife is Environmental education officer. The patient is accompanied by his wife today.  Allergies: No Known Allergies  Current  Medications: Current Outpatient Medications  Medication Sig Dispense Refill  . acetaminophen (TYLENOL) 650 MG CR tablet     . albuterol (PROVENTIL HFA;VENTOLIN HFA) 108 (90 Base) MCG/ACT inhaler Inhale 1-2 puffs into the lungs every 6 (six) hours as needed for wheezing or shortness of breath.    Marland Kitchen aspirin EC 81 MG tablet Take 81 mg by mouth daily.    . Calcium Carb-Cholecalciferol (CALCIUM 600+D3 PO) Take 1 tablet by mouth 2 (two) times daily.    . clotrimazole-betamethasone (LOTRISONE) cream Apply 1 application topically daily.    . diclofenac Sodium (VOLTAREN) 1 % GEL Apply topically 4 (four) times daily.    . famotidine (PEPCID) 10 MG tablet Take 10 mg by mouth 2 (two) times daily as needed for heartburn or indigestion.    . ferrous sulfate 325 (65 FE) MG tablet Take 1 tablet by mouth daily with breakfast.    . glipiZIDE (GLUCOTROL) 5 MG tablet Take 5-10 mg by mouth See admin instructions. Take 2 tablets (10 mg) by mouth in the morning & take 1 tablet (5 mg) by mouth in the evening.    Marland Kitchen LANTUS SOLOSTAR 100 UNIT/ML Solostar Pen Inject 38 Units into the skin every evening.    . metFORMIN (GLUCOPHAGE-XR) 500 MG 24 hr tablet Take 1,000 mg by mouth 2 (two) times daily.    . methocarbamol (ROBAXIN) 500 MG tablet Take by mouth.    . nystatin (MYCOSTATIN/NYSTOP) powder Apply 1 g topically 2 (two) times daily as needed (skin irritation in skin fold areas).    . Omega-3 Fatty Acids (FISH OIL) 1000 MG CAPS Take 1,000 mg by mouth every evening.    . pioglitazone (ACTOS) 15 MG tablet Take by mouth.    . predniSONE (DELTASONE) 5 MG tablet Take 5 mg by mouth daily.    . ramipril (ALTACE) 10 MG capsule Take 10 mg by mouth 2 (two) times daily.     . sildenafil (REVATIO) 20 MG tablet May take up to 5 tabs 1/2 hr before sexual intercourse    . simvastatin (ZOCOR) 80 MG tablet Take 40 mg by mouth every evening.     . tamsulosin (FLOMAX) 0.4 MG CAPS capsule Take 0.4 mg by mouth daily.    [place] 5 MG TABS  Take 5 mg by mouth 2 (two) times daily.    Marland Kitchen linagliptin (TRADJENTA) 5 MG TABS tablet Take 5 mg by mouth daily. (Patient not taking: Reported on 04/08/2020)    . oxyCODONE-acetaminophen (PERCOCET/ROXICET) 5-325 MG tablet Take 1 tablet by mouth 4 (four) times daily as needed for pain. (Patient not taking: Reported on 04/08/2020)     No current facility-administered medications for this visit.    Review of Systems  Constitutional: Negative for chills, diaphoresis, fever, malaise/fatigue and weight loss.  HENT: Negative for congestion, ear discharge, ear pain, hearing loss, nosebleeds, sinus pain, sore throat and tinnitus.   Eyes: Negative for blurred vision.  Respiratory: Positive for shortness of breath (on exertion). Negative for cough, hemoptysis and sputum production.   Cardiovascular: Negative for chest pain, palpitations and leg swelling.  Gastrointestinal: Positive for constipation (1-2 BM per week) and heartburn (occasional). Negative for abdominal pain, blood  in stool, diarrhea, melena, nausea and vomiting.  Genitourinary: Negative for dysuria, frequency, hematuria and urgency.  Musculoskeletal: Positive for joint pain (rheumatoid arthritis). Negative for back pain, myalgias and neck pain.  Skin: Negative for itching and rash (psoriasis).       Recent skin infections.  Neurological: Negative for dizziness, tingling, sensory change, weakness and headaches.  Endo/Heme/Allergies: Does not bruise/bleed easily.  Psychiatric/Behavioral: Negative for depression and memory loss. The patient is not nervous/anxious and does not have insomnia.   All other systems reviewed and are negative.  Performance status (ECOG): 1-2  Vitals Blood pressure 110/69, pulse 68, temperature (!) 96 F (35.6 C), temperature source Tympanic, resp. rate 18, weight 218 lb 4.1 oz (99 kg), SpO2 99 %.   Physical Exam Vitals and nursing note reviewed.  Constitutional:      General: He is not in acute distress.     Appearance: He is not diaphoretic.     Comments: Cane by his side. Requires assistance onto exam table.  HENT:     Head: Normocephalic and atraumatic.     Comments: Gray hair.    Mouth/Throat:     Mouth: Mucous membranes are moist.     Pharynx: Oropharynx is clear.  Eyes:     General: No scleral icterus.    Extraocular Movements: Extraocular movements intact.     Conjunctiva/sclera: Conjunctivae normal.     Pupils: Pupils are equal, round, and reactive to light.     Comments: Glasses. Blue eyes.  Cardiovascular:     Rate and Rhythm: Normal rate and regular rhythm.     Heart sounds: Normal heart sounds. No murmur heard.   Pulmonary:     Effort: Pulmonary effort is normal. No respiratory distress.     Breath sounds: Normal breath sounds. No wheezing or rales.  Chest:     Chest wall: No tenderness.  Breasts:     Right: No axillary adenopathy or supraclavicular adenopathy.     Left: No axillary adenopathy or supraclavicular adenopathy.    Abdominal:     General: Bowel sounds are normal. There is no distension.     Palpations: Abdomen is soft. There is no mass.     Tenderness: There is no abdominal tenderness. There is no guarding or rebound.  Musculoskeletal:        General: No swelling or tenderness. Normal range of motion.     Cervical back: Normal range of motion and neck supple.     Comments: Ulnar deviation.  Lymphadenopathy:     Head:     Right side of head: No preauricular, posterior auricular or occipital adenopathy.     Left side of head: No preauricular, posterior auricular or occipital adenopathy.     Cervical: No cervical adenopathy.     Upper Body:     Right upper body: No supraclavicular or axillary adenopathy.     Left upper body: No supraclavicular or axillary adenopathy.     Lower Body: No right inguinal adenopathy. No left inguinal adenopathy.  Skin:    General: Skin is warm and dry.  Neurological:     Mental Status: He is alert and oriented to person,  place, and time.  Psychiatric:        Behavior: Behavior normal.        Thought Content: Thought content normal.        Judgment: Judgment normal.    No visits with results within 3 Day(s) from this visit.  Latest known visit with results  is:  Admission on 04/20/2018, Discharged on 04/20/2018  Component Date Value Ref Range Status  . Glucose-Capillary 04/20/2018 104* 70 - 99 mg/dL Final  . Specimen Description 04/20/2018    Final                   Value:WOUND Performed at [facility], [address] [place], [place] [address]   . Special Requests 04/20/2018    Final                   Value:NONE Performed at [facility], [address] [place], [place] [address]   . Gram Stain 04/20/2018    Final                   Value:RARE WBC PRESENT, PREDOMINANTLY MONONUCLEAR NO ORGANISMS SEEN   . Culture 04/20/2018    Final                   Value:FEW PSEUDOMONAS AERUGINOSA NO ANAEROBES ISOLATED Performed at [facility], [place] [address], [place], [place] [address]   . Report Status 04/20/2018 04/25/2018 FINAL   Final  . Organism ID, Bacteria 04/20/2018 PSEUDOMONAS AERUGINOSA   Final  . SURGICAL PATHOLOGY 04/20/2018    Final                   Value:Surgical Pathology CASE: [id number] PATIENT: Corinna Lines Surgical Pathology Report   SPECIMEN SUBMITTED: A. Toe, right third, amputation  CLINICAL HISTORY: Chronic hammertoe with ulcer and exposed bone  PRE-OPERATIVE DIAGNOSIS: Osteomyelitis right foot M86.171  POST-OPERATIVE DIAGNOSIS: Same as pre-op  DIAGNOSIS: A. TOE, RIGHT THIRD; AMPUTATION: - ULCER AND ACUTE OSTEOMYELITIS. - PROXIMAL SOFT TISSUE MARGIN IS VIABLE. - PROXIMAL ARTICULAR CARTILAGE/BONE MARGIN IS FREE OF OSTEOMYELITIS.  GROSS DESCRIPTION: A. Labeled: Amputation right third toe Received: Formalin Tissue fragment(s): 1 Size: 6.3 x 1.5 x 1.5 cm Description: Received is a digit amputation specimen.  The surgical resection  margin grossly appears viable and consists of skin, soft tissue, and bone. The skin/soft tissue circumferential resection margin is inked blue, and the proximal bone resection margin is inked black. The skin surface displays a 0.4 x 0.4 cm ulcerative defec                         t, 0.2 cm from the closest skin resection margin.  The bone parenchyma underlying the skin defect is severely softened. The remaining skin surface is tan and otherwise grossly unremarkable.  The toenail displays a very slight yellow discoloration and thickening.  No additional abnormalities are grossly identified.  Representative sections are submitted following decalcification as follows: 1-2 - entire, bisected skin defect and underlying softened bone parenchyma in relation to blue inked resection margin 3 - perpendicular sections in relation to proximal bone resection margin (trisected; entirely submitted)  Final Diagnosis performed by Bryan Lemma, MD.   Electronically signed 04/23/2018 4:50:56PM The electronic signature indicates that the named Attending Pathologist has evaluated the specimen  Technical component performed at [facility], [address], [place], [place] [address] Lab: [phone] Dir: Rush Farmer, MD, MMM  Professional component performed at [facility]                         , [facility], [place], [place], [place] [address] Lab: [phone] Dir: Dellia Nims. Rubinas, MD  . Glucose-Capillary 04/20/2018 126* 70 - 99 mg/dL Final  Assessment:  [place] is a 80 y.o. male with iron deficiency anemia.  Diet appears good.  He denies any bleeding. Urinalysis on 02/18/2020 revealed no hematuria.    CBC on 02/18/2020 revealed a hematocrit 34.6, hemoglobin 10.0, MCV 70.0, platelets 298,000, WBC 8,000.  Ferritin was 8 with an iron saturation of 5% and a TIBC of 524.  Colonoscopy on 04/17/2014 revealed 4 tubular adenomas.  Guaiac cards were negative.   He received iron  dextran 1000 mg IV on 05/15/2014 and 06/12/2014.  Ferritin has been followed: 6 on 04/24/2014, 144 on 08/28/2014, 88 on 12/25/2014, and 8 on 03/17/2020.  Colonoscopy on 04/17/2014 revealed 4 tubular adenomas.   Symptomatically, he has been "alright."  He started oral iron daily around 03/17/2020. His wife starting began managing his medications a month ago.  Exam is unremarkable.  Plan: 1.   Labs today: CBC with diff, ferritin, iron studies, retic. 2.   Iron deficiency  He has had intermittent microcytic indices (04/2014 then 03/2020) c/w iron deficiency.   Ferritin < 10 on both occasions.  Colonoscopy in 04/2014 revealed no source of bleeding.   Guaiac cards were negative.  Urinalysis on 02/18/2020 revealed no hematuria.  Discuss plan for Venofer for if iron stores unable to be replete with oral iron.   Information on Venofer provided.  Preauth Venofer. 3.   RTC in 1 week for MD assessment, review of labs, and +/- Venofer.  I discussed the assessment and treatment plan with the patient.  The patient was provided an opportunity to ask questions and all were answered.  The patient agreed with the plan and demonstrated an understanding of the instructions.  The patient was advised to call back if the symptoms worsen or if the condition fails to improve as anticipated.  I provided 23 minutes of face-to-face time during this this encounter and > 50% was spent counseling as documented under my assessment and plan.   An additional 15 minutes were spent reviewing his chart (Epic and Care Everywhere) including notes, labs, and imaging studies.    Keiji Melland C. Mike Gip, MD, PhD    04/08/2020, 11:48 AM  I, Mirian Mo Tufford, am acting as Education administrator for [employer]. Mike Gip, MD, PhD.  I, Dianely Krehbiel C. Mike Gip, MD, have reviewed the above documentation for accuracy and completeness, and I agree with the above.

## 2020-04-08 ENCOUNTER — Inpatient Hospital Stay: Payer: Medicare Other | Attending: Hematology and Oncology | Admitting: Hematology and Oncology

## 2020-04-08 ENCOUNTER — Encounter: Payer: Self-pay | Admitting: Hematology and Oncology

## 2020-04-08 ENCOUNTER — Inpatient Hospital Stay: Payer: Medicare Other

## 2020-04-08 ENCOUNTER — Telehealth: Payer: Self-pay | Admitting: *Deleted

## 2020-04-08 ENCOUNTER — Other Ambulatory Visit: Payer: Self-pay

## 2020-04-08 VITALS — BP 110/69 | HR 68 | Temp 96.0°F | Resp 18 | Wt 218.3 lb

## 2020-04-08 DIAGNOSIS — Z87891 Personal history of nicotine dependence: Secondary | ICD-10-CM | POA: Insufficient documentation

## 2020-04-08 DIAGNOSIS — D509 Iron deficiency anemia, unspecified: Secondary | ICD-10-CM

## 2020-04-08 LAB — CBC WITH DIFFERENTIAL/PLATELET
Abs Immature Granulocytes: 0.05 10*3/uL (ref 0.00–0.07)
Basophils Absolute: 0 10*3/uL (ref 0.0–0.1)
Basophils Relative: 0 %
Eosinophils Absolute: 0.1 10*3/uL (ref 0.0–0.5)
Eosinophils Relative: 1 %
HCT: 35.9 % — ABNORMAL LOW (ref 39.0–52.0)
Hemoglobin: 10.6 g/dL — ABNORMAL LOW (ref 13.0–17.0)
Immature Granulocytes: 1 %
Lymphocytes Relative: 16 %
Lymphs Abs: 1.2 10*3/uL (ref 0.7–4.0)
MCH: 20.3 pg — ABNORMAL LOW (ref 26.0–34.0)
MCHC: 29.5 g/dL — ABNORMAL LOW (ref 30.0–36.0)
MCV: 68.6 fL — ABNORMAL LOW (ref 80.0–100.0)
Monocytes Absolute: 0.5 10*3/uL (ref 0.1–1.0)
Monocytes Relative: 7 %
Neutro Abs: 5.5 10*3/uL (ref 1.7–7.7)
Neutrophils Relative %: 75 %
Platelets: 251 10*3/uL (ref 150–400)
RBC: 5.23 MIL/uL (ref 4.22–5.81)
RDW: 20.2 % — ABNORMAL HIGH (ref 11.5–15.5)
WBC: 7.3 10*3/uL (ref 4.0–10.5)
nRBC: 0 % (ref 0.0–0.2)

## 2020-04-08 LAB — RETICULOCYTES
Immature Retic Fract: 29.5 % — ABNORMAL HIGH (ref 2.3–15.9)
RBC.: 5.17 MIL/uL (ref 4.22–5.81)
Retic Count, Absolute: 81.7 10*3/uL (ref 19.0–186.0)
Retic Ct Pct: 1.6 % (ref 0.4–3.1)

## 2020-04-08 LAB — IRON AND TIBC
Iron: 22 ug/dL — ABNORMAL LOW (ref 45–182)
Saturation Ratios: 5 % — ABNORMAL LOW (ref 17.9–39.5)
TIBC: 489 ug/dL — ABNORMAL HIGH (ref 250–450)
UIBC: 467 ug/dL

## 2020-04-08 LAB — FERRITIN: Ferritin: 10 ng/mL — ABNORMAL LOW (ref 24–336)

## 2020-04-08 NOTE — Telephone Encounter (Signed)
Dr. Loletha Grayer - no PA needed for patient's venofer.

## 2020-04-08 NOTE — Patient Instructions (Signed)

## 2020-04-14 NOTE — Progress Notes (Signed)
[facility]  [address], [address] [place], [place] [address] Phone: [phone]  Fax: [phone]   Clinic Day:  04/15/2020  Referring physician: Valera Castle, *  Chief Complaint: Samuel Mendoza is a 80 y.o. male with iron deficiency anemia who is seen for review of work-up and discussion regarding direction of therapy.  HPI: The patient was last seen in the hematology clinic on 04/08/2020 for new patient assessment. At that time, he felt "alright."  He denied any bleeding.  He started oral iron daily around 03/17/2020. His wife began managing his medications a month before.  Exam was unremarkable.  Work-up revealed a hematocrit of 35.9, hemoglobin 10.6, MCV 68.6, platelets 251,000, WBC 7,300. Ferritin was 10 with an iron saturation of 5% and a TIBC of 489. Retic count was 1.6%.   During the interim, he has been "hanging in there like a loose tooth." He is short of breath on exertion. His energy level is low. The rest of his symptoms are stable.  He would like to continue oral iron to see if his counts improve.   Past Medical History:  Diagnosis Date  . Arthritis   . Asbestos exposure   . COPD (chronic obstructive pulmonary disease) ([place])   . COPD, moderate ([place])   . Diabetes mellitus without complication ([place])   . Dyslipidemia   . ED (erectile dysfunction)   . Hypertension   . IDA (iron deficiency anemia)   . Obstructive sleep apnea   . Renal disorder   . Rheumatoid arthritis of multiple sites with negative rheumatoid factor [facility])     Past Surgical History:  Procedure Laterality Date  . AMPUTATION TOE Right 04/20/2018   Procedure: AMPUTATION TOE MPJ/RT 3RD TOE;  Surgeon: Sharlotte Alamo, DPM;  Location: [facility] ORS;  Service: Podiatry;  Laterality: Right;  . JOINT REPLACEMENT Right 2008    History reviewed. No pertinent family history.  Social History:  reports that he has quit smoking. His smoking use included cigarettes. He smoked 1.00  pack per day. He has never used smokeless tobacco. He reports previous alcohol use. He reports that he does not use drugs. He smoked half a pack per day for about 35 years and quit 30 years ago. 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 His wife is Environmental education officer. The patient is accompanied by his wife today.  Allergies: No Known Allergies  Current Medications: Current Outpatient Medications  Medication Sig Dispense Refill  . acetaminophen (TYLENOL) 650 MG CR tablet     . albuterol (PROVENTIL HFA;VENTOLIN HFA) 108 (90 Base) MCG/ACT inhaler Inhale 1-2 puffs into the lungs every 6 (six) hours as needed for wheezing or shortness of breath.    Marland Kitchen aspirin EC 81 MG tablet Take 81 mg by mouth daily.    . Calcium Carb-Cholecalciferol (CALCIUM 600+D3 PO) Take 1 tablet by mouth 2 (two) times daily.    . clotrimazole-betamethasone (LOTRISONE) cream Apply 1 application topically daily.    . diclofenac Sodium (VOLTAREN) 1 % GEL Apply topically 4 (four) times daily.    . famotidine (PEPCID) 10 MG tablet Take 10 mg by mouth 2 (two) times daily as needed for heartburn or indigestion.    . ferrous sulfate 325 (65 FE) MG tablet Take 1 tablet by mouth daily with breakfast.    . glipiZIDE (GLUCOTROL) 5 MG tablet Take 5-10 mg by mouth See admin instructions. Take 2 tablets (10 mg) by mouth in the morning & take 1  tablet (5 mg) by mouth in the evening.    Marland Kitchen LANTUS SOLOSTAR 100 UNIT/ML Solostar Pen Inject 38 Units into the skin every evening.    . metFORMIN (GLUCOPHAGE-XR) 500 MG 24 hr tablet Take 1,000 mg by mouth 2 (two) times daily.    . methocarbamol (ROBAXIN) 500 MG tablet Take by mouth.    . nystatin (MYCOSTATIN/NYSTOP) powder Apply 1 g topically 2 (two) times daily as needed (skin irritation in skin fold areas).    . Omega-3 Fatty Acids (FISH OIL) 1000 MG CAPS Take 1,000 mg by mouth every evening.    . pioglitazone (ACTOS) 15 MG tablet Take by mouth.    . predniSONE (DELTASONE) 5 MG tablet Take 5 mg by  mouth daily.    . ramipril (ALTACE) 10 MG capsule Take 10 mg by mouth 2 (two) times daily.     . sildenafil (REVATIO) 20 MG tablet May take up to 5 tabs 1/2 hr before sexual intercourse    . simvastatin (ZOCOR) 80 MG tablet Take 40 mg by mouth every evening.     . tamsulosin (FLOMAX) 0.4 MG CAPS capsule Take 0.4 mg by mouth daily.    Morrie Sheldon 5 MG TABS Take 5 mg by mouth 2 (two) times daily.    Marland Kitchen linagliptin (TRADJENTA) 5 MG TABS tablet Take 5 mg by mouth daily. (Patient not taking: No sig reported)    . oxyCODONE-acetaminophen (PERCOCET/ROXICET) 5-325 MG tablet Take 1 tablet by mouth 4 (four) times daily as needed for pain. (Patient not taking: No sig reported)     No current facility-administered medications for this visit.    Review of Systems  Constitutional: Positive for malaise/fatigue (low energy). Negative for chills, diaphoresis, fever and weight loss (up 2 lbs).       "Hanging in there like a loose tooth."  HENT: Negative for congestion, ear discharge, ear pain, hearing loss, nosebleeds, sinus pain, sore throat and tinnitus.   Eyes: Negative for blurred vision.  Respiratory: Positive for shortness of breath (on exertion). Negative for cough, hemoptysis and sputum production.   Cardiovascular: Negative for chest pain, palpitations and leg swelling.  Gastrointestinal: Positive for constipation (1-2 BM per week) and heartburn (occasional). Negative for abdominal pain, blood in stool, diarrhea, melena, nausea and vomiting.  Genitourinary: Negative for dysuria, frequency, hematuria and urgency.  Musculoskeletal: Positive for joint pain (rheumatoid arthritis). Negative for back pain, myalgias and neck pain.  Skin: Negative for itching and rash (psoriasis).  Neurological: Negative for dizziness, tingling, sensory change, weakness and headaches.  Endo/Heme/Allergies: Does not bruise/bleed easily.  Psychiatric/Behavioral: Negative for depression and memory loss. The patient is not  nervous/anxious and does not have insomnia.   All other systems reviewed and are negative.  Performance status (ECOG): 1-2  Vitals Blood pressure (!) 142/62, pulse 97, temperature (!) 97.2 F (36.2 C), resp. rate 18, weight 220 lb 10.9 oz (100.1 kg).   Physical Exam Vitals and nursing note reviewed.  Constitutional:      General: He is not in acute distress.    Appearance: He is not diaphoretic.     Comments: Cane by his side. Requires assistance onto exam table.  HENT:     Head: Normocephalic and atraumatic.     Comments: Gray hair.    Mouth/Throat:     Mouth: Mucous membranes are moist.     Pharynx: Oropharynx is clear.  Eyes:     General: No scleral icterus.    Extraocular Movements: Extraocular movements intact.  Conjunctiva/sclera: Conjunctivae normal.     Pupils: Pupils are equal, round, and reactive to light.     Comments: Glasses. Blue eyes.  Cardiovascular:     Rate and Rhythm: Normal rate and regular rhythm.     Heart sounds: Normal heart sounds. No murmur heard.   Pulmonary:     Effort: Pulmonary effort is normal. No respiratory distress.     Breath sounds: Normal breath sounds. No wheezing or rales.  Chest:     Chest wall: No tenderness.  Breasts:     Right: No axillary adenopathy or supraclavicular adenopathy.     Left: No axillary adenopathy or supraclavicular adenopathy.    Abdominal:     General: Bowel sounds are normal. There is no distension.     Palpations: Abdomen is soft. There is no mass.     Tenderness: There is no abdominal tenderness. There is no guarding or rebound.  Musculoskeletal:        General: No swelling or tenderness. Normal range of motion.     Cervical back: Normal range of motion and neck supple.     Comments: Ulnar deviation.  Lymphadenopathy:     Head:     Right side of head: No preauricular, posterior auricular or occipital adenopathy.     Left side of head: No preauricular, posterior auricular or occipital adenopathy.      Cervical: No cervical adenopathy.     Upper Body:     Right upper body: No supraclavicular or axillary adenopathy.     Left upper body: No supraclavicular or axillary adenopathy.     Lower Body: No right inguinal adenopathy. No left inguinal adenopathy.  Skin:    General: Skin is warm and dry.  Neurological:     Mental Status: He is alert and oriented to person, place, and time.  Psychiatric:        Behavior: Behavior normal.        Thought Content: Thought content normal.        Judgment: Judgment normal.    No visits with results within 3 Day(s) from this visit.  Latest known visit with results is:  Appointment on 04/08/2020  Component Date Value Ref Range Status  . Retic Ct Pct 04/08/2020 1.6  0.4 - 3.1 % Final  . RBC. 04/08/2020 5.17  4.22 - 5.81 MIL/uL Final  . Retic Count, Absolute 04/08/2020 81.7  19.0 - 186.0 K/uL Final  . Immature Retic Fract 04/08/2020 29.5* 2.3 - 15.9 % Final   Performed at [facility], [address] [place], [place] [address]  . Iron 04/08/2020 22* 45 - 182 ug/dL Final  . TIBC 04/08/2020 489* 250 - 450 ug/dL Final  . Saturation Ratios 04/08/2020 5* 17.9 - 39.5 % Final  . UIBC 04/08/2020 467  ug/dL Final   Performed at [facility], [address], [place], [place] [address]  . Ferritin 04/08/2020 10* 24 - 336 ng/mL Final   Performed at [facility], [address] [place], [place] [address]  . WBC 04/08/2020 7.3  4.0 - 10.5 K/uL Final  . RBC 04/08/2020 5.23  4.22 - 5.81 MIL/uL Final  . Hemoglobin 04/08/2020 10.6* 13.0 - 17.0 g/dL Final  . HCT 04/08/2020 35.9* 39.0 - 52.0 % Final  . MCV 04/08/2020 68.6* 80.0 - 100.0 fL Final  . MCH 04/08/2020 20.3* 26.0 - 34.0 pg Final  . MCHC 04/08/2020 29.5* 30.0 - 36.0 g/dL Final  . RDW 04/08/2020 20.2* 11.5 - 15.5 %  Final  . Platelets 04/08/2020 251  150 - 400 K/uL Final  . nRBC 04/08/2020 0.0  0.0 - 0.2 % Final  . Neutrophils Relative % 04/08/2020 75  % Final  . Neutro Abs  04/08/2020 5.5  1.7 - 7.7 K/uL Final  . Lymphocytes Relative 04/08/2020 16  % Final  . Lymphs Abs 04/08/2020 1.2  0.7 - 4.0 K/uL Final  . Monocytes Relative 04/08/2020 7  % Final  . Monocytes Absolute 04/08/2020 0.5  0.1 - 1.0 K/uL Final  . Eosinophils Relative 04/08/2020 1  % Final  . Eosinophils Absolute 04/08/2020 0.1  0.0 - 0.5 K/uL Final  . Basophils Relative 04/08/2020 0  % Final  . Basophils Absolute 04/08/2020 0.0  0.0 - 0.1 K/uL Final  . Immature Granulocytes 04/08/2020 1  % Final  . Abs Immature Granulocytes 04/08/2020 0.05  0.00 - 0.07 K/uL Final   Performed at [facility], [address] [place], [place] [address]    Assessment:  Samuel Mendoza is a 80 y.o. male with iron deficiency anemia.  Diet appears good.  He denies any bleeding. Urinalysis on 02/18/2020 revealed no hematuria.    CBC on 02/18/2020 revealed a hematocrit 34.6, hemoglobin 10.0, MCV 70.0, platelets 298,000, WBC 8,000.  Ferritin was 8 with an iron saturation of 5% and a TIBC of 524.  Work-up on 04/08/2020 revealed a hematocrit of 35.9, hemoglobin 10.6, MCV 68.6, platelets 251,000, WBC 7,300. Ferritin was 10 with an iron saturation of 5% and a TIBC of 489. Retic count was 1.6%.   Colonoscopy on 04/17/2014 revealed 4 tubular adenomas.  Guaiac cards were negative.   He received iron dextran 1000 mg IV on 05/15/2014 and 06/12/2014.  Ferritin has been followed: 6 on 04/24/2014, 144 on 08/28/2014, 88 on 12/25/2014, 8 on 03/17/2020, and 10 on 04/08/2020.  Colonoscopy on 04/17/2014 revealed 4 tubular adenomas.   Symptomatically, he describes shortness of breath on exertion. His energy level is low.  He would like to continue oral iron to see if his counts improve.  Exam is stable.  Plan: 1.   Review work-up. 2.   Iron deficiency  Hematocrit 35.9.  Hemoglobin 10.6.  MCV 68.6 on 04/08/2020.  He has had intermittent microcytic indices (04/2014 then 03/2020) c/w iron deficiency.   Ferritin < 10 on  both occasions.  Colonoscopy in 04/2014 revealed no source of bleeding.   Guaiac cards were negative.  Urinalysis on 02/18/2020 revealed no hematuria.  He would like to continue oral iron.  Reserve Venofer if unable to replete iron stores. 3.   RTC in 2 months for labs (CBC, ferritin, iron studies). 4.   RN to call patient with 2 month labs for possible initiation of Venofer. 5.   RTC in 4 months for MD assessment and labs (CBC, ferritin, iron studies- day before) and +/- Venofer.  I discussed the assessment and treatment plan with the patient.  The patient was provided an opportunity to ask questions and all were answered.  The patient agreed with the plan and demonstrated an understanding of the instructions.  The patient was advised to call back if the symptoms worsen or if the condition fails to improve as anticipated.   Nailyn Dearinger C. Mike Gip, MD, PhD    04/15/2020, 2:59 PM  I, Mirian Mo Tufford, am acting as Education administrator for [employer]. Mike Gip, MD, PhD.  I, Lasonia Casino C. Mike Gip, MD, have reviewed the above documentation for accuracy and completeness, and I agree with the above.

## 2020-04-15 ENCOUNTER — Inpatient Hospital Stay: Payer: Medicare Other | Attending: Hematology and Oncology | Admitting: Hematology and Oncology

## 2020-04-15 ENCOUNTER — Other Ambulatory Visit: Payer: Self-pay

## 2020-04-15 ENCOUNTER — Encounter: Payer: Self-pay | Admitting: Hematology and Oncology

## 2020-04-15 ENCOUNTER — Inpatient Hospital Stay: Payer: Medicare Other

## 2020-04-15 VITALS — BP 142/62 | HR 97 | Temp 97.2°F | Resp 18 | Wt 220.7 lb

## 2020-04-15 DIAGNOSIS — D509 Iron deficiency anemia, unspecified: Secondary | ICD-10-CM | POA: Diagnosis present

## 2020-04-15 DIAGNOSIS — Z87891 Personal history of nicotine dependence: Secondary | ICD-10-CM | POA: Insufficient documentation

## 2020-04-15 NOTE — Progress Notes (Signed)
Pt here for follow up. No new concerns voiced.   

## 2020-06-24 ENCOUNTER — Other Ambulatory Visit: Payer: Medicare Other

## 2020-06-25 ENCOUNTER — Other Ambulatory Visit: Payer: Self-pay

## 2020-06-25 DIAGNOSIS — D509 Iron deficiency anemia, unspecified: Secondary | ICD-10-CM

## 2020-06-26 ENCOUNTER — Other Ambulatory Visit: Payer: Self-pay

## 2020-06-26 ENCOUNTER — Inpatient Hospital Stay: Payer: Medicare Other | Attending: Internal Medicine

## 2020-06-26 DIAGNOSIS — D509 Iron deficiency anemia, unspecified: Secondary | ICD-10-CM | POA: Diagnosis present

## 2020-06-26 LAB — CBC WITH DIFFERENTIAL/PLATELET
Abs Immature Granulocytes: 0.03 10*3/uL (ref 0.00–0.07)
Basophils Absolute: 0 10*3/uL (ref 0.0–0.1)
Basophils Relative: 0 %
Eosinophils Absolute: 0.1 10*3/uL (ref 0.0–0.5)
Eosinophils Relative: 1 %
HCT: 34.7 % — ABNORMAL LOW (ref 39.0–52.0)
Hemoglobin: 10.5 g/dL — ABNORMAL LOW (ref 13.0–17.0)
Immature Granulocytes: 0 %
Lymphocytes Relative: 16 %
Lymphs Abs: 1.1 10*3/uL (ref 0.7–4.0)
MCH: 21.5 pg — ABNORMAL LOW (ref 26.0–34.0)
MCHC: 30.3 g/dL (ref 30.0–36.0)
MCV: 71 fL — ABNORMAL LOW (ref 80.0–100.0)
Monocytes Absolute: 0.5 10*3/uL (ref 0.1–1.0)
Monocytes Relative: 7 %
Neutro Abs: 5.1 10*3/uL (ref 1.7–7.7)
Neutrophils Relative %: 76 %
Platelets: 246 10*3/uL (ref 150–400)
RBC: 4.89 MIL/uL (ref 4.22–5.81)
RDW: 19.2 % — ABNORMAL HIGH (ref 11.5–15.5)
WBC: 6.7 10*3/uL (ref 4.0–10.5)
nRBC: 0 % (ref 0.0–0.2)

## 2020-06-26 LAB — IRON AND TIBC
Iron: 95 ug/dL (ref 45–182)
Saturation Ratios: 21 % (ref 17.9–39.5)
TIBC: 456 ug/dL — ABNORMAL HIGH (ref 250–450)
UIBC: 361 ug/dL

## 2020-06-26 LAB — FERRITIN: Ferritin: 6 ng/mL — ABNORMAL LOW (ref 24–336)

## 2020-07-18 ENCOUNTER — Telehealth: Payer: Self-pay | Admitting: Internal Medicine

## 2020-07-18 DIAGNOSIS — D509 Iron deficiency anemia, unspecified: Secondary | ICD-10-CM

## 2020-07-18 NOTE — Telephone Encounter (Signed)
H/T-please inform patient that his iron levels/Hb are running low again; would recommend IV iron infusion sooner than later.  If patient is interested-please move the patient's appointment to July 26th- MD; labs- cbc/bmp;LDH- venofer.   Thanks GB

## 2020-07-20 NOTE — Addendum Note (Signed)
Addended by: Gloris Ham on: 07/20/2020 08:24 AM   Modules accepted: Orders

## 2020-07-20 NOTE — Telephone Encounter (Signed)
Spoke with patient. Pt agreeable to come on 7/26 at 1030 am. New apts arranged.

## 2020-08-04 ENCOUNTER — Encounter: Payer: Self-pay | Admitting: Internal Medicine

## 2020-08-04 ENCOUNTER — Other Ambulatory Visit: Payer: Self-pay

## 2020-08-04 ENCOUNTER — Inpatient Hospital Stay: Payer: Medicare Other | Attending: Internal Medicine

## 2020-08-04 ENCOUNTER — Inpatient Hospital Stay (HOSPITAL_BASED_OUTPATIENT_CLINIC_OR_DEPARTMENT_OTHER): Payer: Medicare Other | Admitting: Internal Medicine

## 2020-08-04 ENCOUNTER — Ambulatory Visit: Payer: Medicare Other

## 2020-08-04 VITALS — BP 115/67 | HR 78

## 2020-08-04 DIAGNOSIS — D509 Iron deficiency anemia, unspecified: Secondary | ICD-10-CM

## 2020-08-04 DIAGNOSIS — E611 Iron deficiency: Secondary | ICD-10-CM | POA: Diagnosis not present

## 2020-08-04 LAB — LACTATE DEHYDROGENASE: LDH: 131 U/L (ref 98–192)

## 2020-08-04 LAB — CBC WITH DIFFERENTIAL/PLATELET
Abs Immature Granulocytes: 0.02 10*3/uL (ref 0.00–0.07)
Basophils Absolute: 0 10*3/uL (ref 0.0–0.1)
Basophils Relative: 0 %
Eosinophils Absolute: 0.1 10*3/uL (ref 0.0–0.5)
Eosinophils Relative: 2 %
HCT: 37.1 % — ABNORMAL LOW (ref 39.0–52.0)
Hemoglobin: 11.4 g/dL — ABNORMAL LOW (ref 13.0–17.0)
Immature Granulocytes: 0 %
Lymphocytes Relative: 24 %
Lymphs Abs: 1.6 10*3/uL (ref 0.7–4.0)
MCH: 22.4 pg — ABNORMAL LOW (ref 26.0–34.0)
MCHC: 30.7 g/dL (ref 30.0–36.0)
MCV: 73 fL — ABNORMAL LOW (ref 80.0–100.0)
Monocytes Absolute: 0.5 10*3/uL (ref 0.1–1.0)
Monocytes Relative: 8 %
Neutro Abs: 4.2 10*3/uL (ref 1.7–7.7)
Neutrophils Relative %: 66 %
Platelets: 218 10*3/uL (ref 150–400)
RBC: 5.08 MIL/uL (ref 4.22–5.81)
RDW: 19.1 % — ABNORMAL HIGH (ref 11.5–15.5)
WBC: 6.4 10*3/uL (ref 4.0–10.5)
nRBC: 0 % (ref 0.0–0.2)

## 2020-08-04 LAB — BASIC METABOLIC PANEL
Anion gap: 8 (ref 5–15)
BUN: 12 mg/dL (ref 8–23)
CO2: 27 mmol/L (ref 22–32)
Calcium: 9.4 mg/dL (ref 8.9–10.3)
Chloride: 102 mmol/L (ref 98–111)
Creatinine, Ser: 0.78 mg/dL (ref 0.61–1.24)
GFR, Estimated: >60 mL/min (ref >60)
Glucose, Bld: 166 mg/dL — ABNORMAL HIGH (ref 70–99)
Potassium: 4.1 mmol/L (ref 3.5–5.1)
Sodium: 137 mmol/L (ref 135–145)

## 2020-08-04 MED ORDER — IRON SUCROSE 20 MG/ML IV SOLN
200.0000 mg | Freq: Once | INTRAVENOUS | Status: AC
  Administered 2020-08-04: 200 mg via INTRAVENOUS
  Filled 2020-08-04: qty 10

## 2020-08-04 MED ORDER — SODIUM CHLORIDE 0.9 % IV SOLN: 200.0000 mg | Freq: Once | INTRAVENOUS | Status: DC

## 2020-08-04 MED ORDER — SODIUM CHLORIDE 0.9 % IV SOLN
Freq: Once | INTRAVENOUS | Status: AC
  Filled 2020-08-04: qty 250

## 2020-08-04 NOTE — Assessment & Plan Note (Addendum)
#  Iron deficiency anemia-unclear etiology-July 2022 hemoglobin around 10; ferritin-6.  Symptomatic with fatigue.  #Proceed with Venofer today; then weekly x3;   #Etiology: Unclear-Colonoscopy on 04/17/2014 revealed 4 tubular adenomas.  Guaiac cards were negative.will need GI eval/ imaging/UA etc.   # DISPOSITION: # venofer today # weekly x3 venofer [total 4] # follow up in 3 months for MD assessment and labs- cbc/bmp; possible- Venofer- Dr.B.

## 2020-08-04 NOTE — Progress Notes (Signed)
[place] CONSULT NOTE  Patient Care Team: Kym Groom Guy Begin, MD as PCP - General (Family Medicine)  CHIEF COMPLAINTS/PURPOSE OF CONSULTATION:  Iron deficiency anemia #  Oncology History   No history exists.     HISTORY OF PRESENTING ILLNESS:  Samuel Mendoza 80 y.o.  male iron deficient anemia unclear etiology is here for follow-up.  Patient is tired.  He feels short of breath on exertion.  Denies any blood in stools or black or stools.  No nausea or vomiting.  No weight loss.  No chest pain.  Denies any blood in urine.   Review of Systems  Constitutional:  Positive for malaise/fatigue. Negative for chills, diaphoresis, fever and weight loss.  HENT:  Negative for nosebleeds and sore throat.   Eyes:  Negative for double vision.  Respiratory:  Positive for shortness of breath. Negative for cough, hemoptysis, sputum production and wheezing.   Cardiovascular:  Negative for chest pain, palpitations, orthopnea and leg swelling.  Gastrointestinal:  Negative for abdominal pain, blood in stool, constipation, diarrhea, heartburn, melena, nausea and vomiting.  Genitourinary:  Negative for dysuria, frequency and urgency.  Musculoskeletal:  Negative for back pain and joint pain.  Skin: Negative.  Negative for itching and rash.  Neurological:  Negative for dizziness, tingling, focal weakness, weakness and headaches.  Endo/Heme/Allergies:  Does not bruise/bleed easily.  Psychiatric/Behavioral:  Negative for depression. The patient is not nervous/anxious and does not have insomnia.     MEDICAL HISTORY:  Past Medical History:  Diagnosis Date   Arthritis    Asbestos exposure    COPD (chronic obstructive pulmonary disease) ([place])    COPD, moderate ([place])    Diabetes mellitus without complication ([place])    Dyslipidemia    ED (erectile dysfunction)    Hypertension    IDA (iron deficiency anemia)    Obstructive sleep apnea    Renal disorder    Rheumatoid arthritis of  multiple sites with negative rheumatoid factor ([place])     SURGICAL HISTORY: Past Surgical History:  Procedure Laterality Date   AMPUTATION TOE Right 04/20/2018   Procedure: AMPUTATION TOE MPJ/RT 3RD TOE;  Surgeon: Sharlotte Alamo, DPM;  Location: [facility] ORS;  Service: Podiatry;  Laterality: Right;   JOINT REPLACEMENT Right 2008    SOCIAL HISTORY: Social History   Socioeconomic History   Marital status: Married    Spouse name: Not on file   Number of children: Not on file   Years of education: Not on file   Highest education level: Not on file  Occupational History   Not on file  Tobacco Use   Smoking status: Former    Packs/day: 1.00    Types: Cigarettes   Smokeless tobacco: Never   Tobacco comments:    Quit: 01/11/1987  Vaping Use   Vaping Use: Never used  Substance and Sexual Activity   Alcohol use: Not Currently   Drug use: Never   Sexual activity: Not on file  Other Topics Concern   Not on file  Social History Narrative   Not on file   Social Determinants of Health   Financial Resource Strain: Not on file  Food Insecurity: Not on file  Transportation Needs: Not on file  Physical Activity: Not on file  Stress: Not on file  Social Connections: Not on file  Intimate Partner Violence: Not on file    FAMILY HISTORY: History reviewed. No pertinent family history.  ALLERGIES:  has No Known Allergies.  MEDICATIONS:  Current Outpatient Medications  Medication  Sig Dispense Refill   acetaminophen (TYLENOL) 650 MG CR tablet      aspirin EC 81 MG tablet Take 81 mg by mouth daily.     Calcium Carb-Cholecalciferol (CALCIUM 600+D3 PO) Take 1 tablet by mouth 2 (two) times daily.     clotrimazole-betamethasone (LOTRISONE) cream Apply 1 application topically daily.     diclofenac Sodium (VOLTAREN) 1 % GEL Apply topically 4 (four) times daily.     doxycycline (PERIOSTAT) 20 MG tablet Take 1 tablet 2x/day with food to treat rash on face     ferrous sulfate 325 (65 FE) MG  tablet Take 1 tablet by mouth daily with breakfast.     glipiZIDE (GLUCOTROL) 5 MG tablet Take 5-10 mg by mouth See admin instructions. Take 2 tablets (10 mg) by mouth in the morning & take 1 tablet (5 mg) by mouth in the evening.     Insulin Pen Needle (B-D ULTRAFINE III SHORT PEN) 31G X 8 MM MISC as directed USE AS DIRECTED     LANTUS SOLOSTAR 100 UNIT/ML Solostar Pen Inject 38 Units into the skin every evening.     metFORMIN (GLUCOPHAGE-XR) 500 MG 24 hr tablet Take 1,000 mg by mouth 2 (two) times daily.     methocarbamol (ROBAXIN) 500 MG tablet Take by mouth.     metroNIDAZOLE (METROGEL) 0.75 % gel Apply topically.     mupirocin ointment (BACTROBAN) 2 % APPLY TOPICALLY THREE (3) TIMES A DAY FOR 7 DAYS. APPLY TO SKIN CANCER TREATMENT SITES 1-2 TIMES A DAY UNTIL HEALED.     Omega-3 Fatty Acids (FISH OIL) 1000 MG CAPS Take 1,000 mg by mouth every evening.     pioglitazone (ACTOS) 15 MG tablet Take by mouth.     predniSONE (DELTASONE) 5 MG tablet Take 5 mg by mouth daily.     ramipril (ALTACE) 10 MG capsule Take 10 mg by mouth 2 (two) times daily.      sildenafil (REVATIO) 20 MG tablet May take up to 5 tabs 1/2 hr before sexual intercourse     simvastatin (ZOCOR) 80 MG tablet Take 40 mg by mouth every evening.      tamsulosin (FLOMAX) 0.4 MG CAPS capsule Take 0.4 mg by mouth daily.     triamcinolone ointment (KENALOG) 0.1 % Apply topically.     XELJANZ 5 MG TABS Take 5 mg by mouth 2 (two) times daily.     albuterol (PROVENTIL HFA;VENTOLIN HFA) 108 (90 Base) MCG/ACT inhaler Inhale 1-2 puffs into the lungs every 6 (six) hours as needed for wheezing or shortness of breath. (Patient not taking: Reported on 08/04/2020)     famotidine (PEPCID) 10 MG tablet Take 10 mg by mouth 2 (two) times daily as needed for heartburn or indigestion. (Patient not taking: Reported on 08/04/2020)     linagliptin (TRADJENTA) 5 MG TABS tablet Take 5 mg by mouth daily. (Patient not taking: No sig reported)     nystatin  (MYCOSTATIN/NYSTOP) powder Apply 1 g topically 2 (two) times daily as needed (skin irritation in skin fold areas). (Patient not taking: Reported on 08/04/2020)     oxyCODONE-acetaminophen (PERCOCET/ROXICET) 5-325 MG tablet Take 1 tablet by mouth 4 (four) times daily as needed for pain. (Patient not taking: No sig reported)     ranitidine (ZANTAC) 150 MG capsule Take by mouth. (Patient not taking: Reported on 08/04/2020)     traMADol (ULTRAM) 50 MG tablet Take by mouth. (Patient not taking: Reported on 08/04/2020)     No current  facility-administered medications for this visit.      Marland Kitchen  PHYSICAL EXAMINATION:   Vitals:   08/04/20 1028  BP: 107/63  Pulse: 76  Resp: 18  Temp: (!) 96.6 F (35.9 C)  SpO2: 100%   Filed Weights   08/04/20 1028  Weight: 216 lb 4.3 oz (98.1 kg)    Physical Exam Vitals and nursing note reviewed.  Constitutional:      Comments: Accompanied by family/wife; Ambulating  with a cane.       HENT:     Head: Normocephalic and atraumatic.     Mouth/Throat:     Pharynx: Oropharynx is clear.  Eyes:     Extraocular Movements: Extraocular movements intact.     Pupils: Pupils are equal, round, and reactive to light.  Cardiovascular:     Rate and Rhythm: Normal rate and regular rhythm.  Pulmonary:     Comments: Decreased breath sounds bilaterally.  Abdominal:     Palpations: Abdomen is soft.  Musculoskeletal:        General: Normal range of motion.     Cervical back: Normal range of motion.  Skin:    General: Skin is warm.  Neurological:     General: No focal deficit present.     Mental Status: He is alert and oriented to person, place, and time.  Psychiatric:        Behavior: Behavior normal.        Judgment: Judgment normal.     LABORATORY DATA:  I have reviewed the data as listed Lab Results  Component Value Date   WBC 6.4 08/04/2020   HGB 11.4 (L) 08/04/2020   HCT 37.1 (L) 08/04/2020   MCV 73.0 (L) 08/04/2020   PLT 218 08/04/2020    Recent Labs    08/04/20 1019  NA 137  K 4.1  CL 102  CO2 27  GLUCOSE 166*  BUN 12  CREATININE 0.78  CALCIUM 9.4  GFRNONAA >60    RADIOGRAPHIC STUDIES: I have personally reviewed the radiological images as listed and agreed with the findings in the report. No results found.  ASSESSMENT & PLAN:   Iron deficiency #Iron deficiency anemia-unclear etiology-July 2022 hemoglobin around 10; ferritin-6.  Symptomatic with fatigue.  #Proceed with Venofer today; then weekly x3;   #Etiology: Unclear-Colonoscopy on 04/17/2014 revealed 4 tubular adenomas.  Guaiac cards were negative.will need GI eval/ imaging/UA etc.    # DISPOSITION: # venofer today # weekly x3 venofer [total 4] # follow up in 3 months for MD assessment and labs- cbc/bmp; possible- Venofer- Dr.B.    All questions were answered. The patient knows to call the clinic with any problems, questions or concerns.    Cammie Sickle, MD 08/04/2020 1:06 PM

## 2020-08-11 ENCOUNTER — Other Ambulatory Visit: Payer: Self-pay

## 2020-08-11 ENCOUNTER — Inpatient Hospital Stay: Payer: Medicare Other | Attending: Internal Medicine

## 2020-08-11 VITALS — BP 124/75 | HR 77 | Temp 96.6°F | Resp 18

## 2020-08-11 DIAGNOSIS — D509 Iron deficiency anemia, unspecified: Secondary | ICD-10-CM | POA: Insufficient documentation

## 2020-08-11 MED ORDER — SODIUM CHLORIDE 0.9 % IV SOLN
INTRAVENOUS | Status: DC | PRN
  Filled 2020-08-11: qty 250

## 2020-08-11 MED ORDER — IRON SUCROSE 20 MG/ML IV SOLN
200.0000 mg | INTRAVENOUS | Status: AC
  Administered 2020-08-11: 200 mg via INTRAVENOUS

## 2020-08-17 ENCOUNTER — Other Ambulatory Visit: Payer: Medicare Other

## 2020-08-18 ENCOUNTER — Inpatient Hospital Stay: Payer: Medicare Other

## 2020-08-18 ENCOUNTER — Ambulatory Visit: Payer: Medicare Other | Admitting: Internal Medicine

## 2020-08-18 ENCOUNTER — Ambulatory Visit: Payer: Medicare Other

## 2020-08-18 ENCOUNTER — Other Ambulatory Visit: Payer: Self-pay

## 2020-08-18 VITALS — BP 125/75 | HR 68 | Temp 96.4°F | Resp 18

## 2020-08-18 DIAGNOSIS — D509 Iron deficiency anemia, unspecified: Secondary | ICD-10-CM

## 2020-08-18 MED ORDER — IRON SUCROSE 20 MG/ML IV SOLN
200.0000 mg | INTRAVENOUS | Status: AC
  Administered 2020-08-18: 200 mg via INTRAVENOUS
  Filled 2020-08-18: qty 10

## 2020-08-18 MED ORDER — SODIUM CHLORIDE 0.9 % IV SOLN
Freq: Once | INTRAVENOUS | Status: AC
  Filled 2020-08-18: qty 250

## 2020-08-18 NOTE — Progress Notes (Signed)
Pt tolerated venofer infusion well today with no problems or complaints. Pt left infusion suite stable and ambulatory.  

## 2020-08-18 NOTE — Patient Instructions (Signed)

## 2020-08-25 ENCOUNTER — Inpatient Hospital Stay: Payer: Medicare Other

## 2020-08-25 ENCOUNTER — Other Ambulatory Visit: Payer: Self-pay

## 2020-08-25 VITALS — BP 124/76 | HR 68 | Temp 96.8°F | Resp 18

## 2020-08-25 DIAGNOSIS — D509 Iron deficiency anemia, unspecified: Secondary | ICD-10-CM | POA: Diagnosis not present

## 2020-08-25 MED ORDER — SODIUM CHLORIDE 0.9 % IV SOLN
Freq: Once | INTRAVENOUS | Status: AC
  Filled 2020-08-25: qty 250

## 2020-08-25 MED ORDER — IRON SUCROSE 20 MG/ML IV SOLN
200.0000 mg | INTRAVENOUS | Status: AC
  Administered 2020-08-25: 200 mg via INTRAVENOUS
  Filled 2020-08-25: qty 10

## 2020-11-03 ENCOUNTER — Inpatient Hospital Stay: Payer: Medicare Other | Attending: Internal Medicine

## 2020-11-03 ENCOUNTER — Other Ambulatory Visit: Payer: Self-pay

## 2020-11-03 ENCOUNTER — Encounter: Payer: Self-pay | Admitting: Internal Medicine

## 2020-11-03 ENCOUNTER — Inpatient Hospital Stay (HOSPITAL_BASED_OUTPATIENT_CLINIC_OR_DEPARTMENT_OTHER): Payer: Medicare Other | Admitting: Internal Medicine

## 2020-11-03 ENCOUNTER — Inpatient Hospital Stay: Payer: Medicare Other

## 2020-11-03 VITALS — BP 118/53 | HR 76 | Temp 96.3°F | Resp 18 | Wt 214.0 lb

## 2020-11-03 DIAGNOSIS — Z79899 Other long term (current) drug therapy: Secondary | ICD-10-CM | POA: Insufficient documentation

## 2020-11-03 DIAGNOSIS — Z794 Long term (current) use of insulin: Secondary | ICD-10-CM | POA: Insufficient documentation

## 2020-11-03 DIAGNOSIS — Z87891 Personal history of nicotine dependence: Secondary | ICD-10-CM | POA: Insufficient documentation

## 2020-11-03 DIAGNOSIS — Z7952 Long term (current) use of systemic steroids: Secondary | ICD-10-CM | POA: Insufficient documentation

## 2020-11-03 DIAGNOSIS — I10 Essential (primary) hypertension: Secondary | ICD-10-CM | POA: Insufficient documentation

## 2020-11-03 DIAGNOSIS — R319 Hematuria, unspecified: Secondary | ICD-10-CM

## 2020-11-03 DIAGNOSIS — D509 Iron deficiency anemia, unspecified: Secondary | ICD-10-CM

## 2020-11-03 DIAGNOSIS — E119 Type 2 diabetes mellitus without complications: Secondary | ICD-10-CM | POA: Insufficient documentation

## 2020-11-03 DIAGNOSIS — M069 Rheumatoid arthritis, unspecified: Secondary | ICD-10-CM | POA: Diagnosis not present

## 2020-11-03 DIAGNOSIS — Z7982 Long term (current) use of aspirin: Secondary | ICD-10-CM | POA: Diagnosis not present

## 2020-11-03 DIAGNOSIS — E611 Iron deficiency: Secondary | ICD-10-CM | POA: Diagnosis not present

## 2020-11-03 LAB — BASIC METABOLIC PANEL
Anion gap: 8 (ref 5–15)
BUN: 14 mg/dL (ref 8–23)
CO2: 25 mmol/L (ref 22–32)
Calcium: 9 mg/dL (ref 8.9–10.3)
Chloride: 103 mmol/L (ref 98–111)
Creatinine, Ser: 0.75 mg/dL (ref 0.61–1.24)
GFR, Estimated: >60 mL/min (ref >60)
Glucose, Bld: 193 mg/dL — ABNORMAL HIGH (ref 70–99)
Potassium: 4.4 mmol/L (ref 3.5–5.1)
Sodium: 136 mmol/L (ref 135–145)

## 2020-11-03 LAB — URINALYSIS, COMPLETE (UACMP) WITH MICROSCOPIC
Bacteria, UA: NONE SEEN
Bilirubin Urine: NEGATIVE
Glucose, UA: 50 mg/dL — AB
Hgb urine dipstick: NEGATIVE
Ketones, ur: 5 mg/dL — AB
Leukocytes,Ua: NEGATIVE
Nitrite: NEGATIVE
Protein, ur: NEGATIVE mg/dL
Specific Gravity, Urine: 1.018 (ref 1.005–1.030)
Squamous Epithelial / HPF: NONE SEEN (ref 0–5)
pH: 5 (ref 5.0–8.0)

## 2020-11-03 LAB — CBC WITH DIFFERENTIAL/PLATELET
Abs Immature Granulocytes: 0.03 10*3/uL (ref 0.00–0.07)
Basophils Absolute: 0 10*3/uL (ref 0.0–0.1)
Basophils Relative: 0 %
Eosinophils Absolute: 0 10*3/uL (ref 0.0–0.5)
Eosinophils Relative: 1 %
HCT: 39.5 % (ref 39.0–52.0)
Hemoglobin: 12.7 g/dL — ABNORMAL LOW (ref 13.0–17.0)
Immature Granulocytes: 1 %
Lymphocytes Relative: 16 %
Lymphs Abs: 1 10*3/uL (ref 0.7–4.0)
MCH: 27.4 pg (ref 26.0–34.0)
MCHC: 32.2 g/dL (ref 30.0–36.0)
MCV: 85.3 fL (ref 80.0–100.0)
Monocytes Absolute: 0.4 10*3/uL (ref 0.1–1.0)
Monocytes Relative: 6 %
Neutro Abs: 4.5 10*3/uL (ref 1.7–7.7)
Neutrophils Relative %: 76 %
Platelets: 194 10*3/uL (ref 150–400)
RBC: 4.63 MIL/uL (ref 4.22–5.81)
RDW: 17.2 % — ABNORMAL HIGH (ref 11.5–15.5)
WBC: 5.9 10*3/uL (ref 4.0–10.5)
nRBC: 0 % (ref 0.0–0.2)

## 2020-11-03 NOTE — Assessment & Plan Note (Addendum)
#  Iron deficiency anemia-unclear etiology-July 2022 hemoglobin around 10; ferritin-6.  Symptomatic with fatigue.  S/p Venofer x4-hemoglobin today is 12.7.;  Hold IV Venofer today.  Continue oral iron.   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  Guaiac cards were negative. will need GI eval/ imaging. ollow up with PCP re: GI referral; if not wife will call us.  Check UA.  Consider CT scan.  # Severe RA-Dr.Patel.   # DISPOSITION: # UA today # NO venofer today # follow up in 4 months for MD assessment and labs- cbc/bmp;iron studies/ferrtin- possible- Venofer- Dr.B.

## 2020-11-03 NOTE — Progress Notes (Signed)
[place] CONSULT NOTE  Patient Care Team: Kym Groom Guy Begin, MD as PCP - General (Family Medicine)  CHIEF COMPLAINTS/PURPOSE OF CONSULTATION: Iron deficiency anemia #  Oncology History   No history exists.     HISTORY OF PRESENTING ILLNESS: pt in wheel chair sec to severe RA; accompanied by wife.  Samuel Mendoza 80 y.o.  male iron deficient anemia unclear etiology is here for follow-up.  Patient recently declined evaluation with colonoscopy as referred by PCP.  Patient is taking p.o. iron.  Energy levels improving.  Continues to have mild shortness of breath mild fatigue.  No weight loss.  No chest pain.  Denies any blood in urine.   Review of Systems  Constitutional:  Positive for malaise/fatigue. Negative for chills, diaphoresis, fever and weight loss.  HENT:  Negative for nosebleeds and sore throat.   Eyes:  Negative for double vision.  Respiratory:  Positive for shortness of breath. Negative for cough, hemoptysis, sputum production and wheezing.   Cardiovascular:  Negative for chest pain, palpitations, orthopnea and leg swelling.  Gastrointestinal:  Negative for abdominal pain, blood in stool, constipation, diarrhea, heartburn, melena, nausea and vomiting.  Genitourinary:  Negative for dysuria, frequency and urgency.  Musculoskeletal:  Negative for back pain and joint pain.  Skin: Negative.  Negative for itching and rash.  Neurological:  Negative for dizziness, tingling, focal weakness, weakness and headaches.  Endo/Heme/Allergies:  Does not bruise/bleed easily.  Psychiatric/Behavioral:  Negative for depression. The patient is not nervous/anxious and does not have insomnia.     MEDICAL HISTORY:  Past Medical History:  Diagnosis Date   Arthritis    Asbestos exposure    COPD (chronic obstructive pulmonary disease) ([place])    COPD, moderate ([place])    Diabetes mellitus without complication ([place])    Dyslipidemia    ED (erectile dysfunction)     Hypertension    IDA (iron deficiency anemia)    Obstructive sleep apnea    Renal disorder    Rheumatoid arthritis of multiple sites with negative rheumatoid factor ([place])     SURGICAL HISTORY: Past Surgical History:  Procedure Laterality Date   AMPUTATION TOE Right 04/20/2018   Procedure: AMPUTATION TOE MPJ/RT 3RD TOE;  Surgeon: Sharlotte Alamo, DPM;  Location: [facility] ORS;  Service: Podiatry;  Laterality: Right;   JOINT REPLACEMENT Right 2008    SOCIAL HISTORY: Social History   Socioeconomic History   Marital status: Married    Spouse name: Not on file   Number of children: Not on file   Years of education: Not on file   Highest education level: Not on file  Occupational History   Not on file  Tobacco Use   Smoking status: Former    Packs/day: 1.00    Types: Cigarettes   Smokeless tobacco: Never   Tobacco comments:    Quit: 01/11/1987  Vaping Use   Vaping Use: Never used  Substance and Sexual Activity   Alcohol use: Not Currently   Drug use: Never   Sexual activity: Not on file  Other Topics Concern   Not on file  Social History Narrative   Not on file   Social Determinants of Health   Financial Resource Strain: Not on file  Food Insecurity: Not on file  Transportation Needs: Not on file  Physical Activity: Not on file  Stress: Not on file  Social Connections: Not on file  Intimate Partner Violence: Not on file    FAMILY HISTORY: History reviewed. No pertinent family history.  ALLERGIES:  has No Known Allergies.  MEDICATIONS:  Current Outpatient Medications  Medication Sig Dispense Refill   acetaminophen (TYLENOL) 650 MG CR tablet      acetaminophen-codeine (TYLENOL #4) 300-60 MG tablet Take by mouth.     albuterol (PROVENTIL HFA;VENTOLIN HFA) 108 (90 Base) MCG/ACT inhaler Inhale 1-2 puffs into the lungs every 6 (six) hours as needed for wheezing or shortness of breath.     aspirin EC 81 MG tablet Take 81 mg by mouth daily.     Calcium Carb-Cholecalciferol  (CALCIUM 600+D3 PO) Take 1 tablet by mouth 2 (two) times daily.     clotrimazole-betamethasone (LOTRISONE) cream Apply 1 application topically daily.     diclofenac Sodium (VOLTAREN) 1 % GEL Apply topically 4 (four) times daily.     doxycycline (PERIOSTAT) 20 MG tablet Take 1 tablet 2x/day with food to treat rash on face     famotidine (PEPCID) 10 MG tablet Take 10 mg by mouth 2 (two) times daily as needed for heartburn or indigestion.     ferrous sulfate 325 (65 FE) MG tablet Take 1 tablet by mouth daily with breakfast.     glipiZIDE (GLUCOTROL) 5 MG tablet Take 5-10 mg by mouth See admin instructions. Take 2 tablets (10 mg) by mouth in the morning & take 1 tablet (5 mg) by mouth in the evening.     Insulin Pen Needle (B-D ULTRAFINE III SHORT PEN) 31G X 8 MM MISC as directed USE AS DIRECTED     LANTUS SOLOSTAR 100 UNIT/ML Solostar Pen Inject 35 Units into the skin every evening.     metFORMIN (GLUCOPHAGE-XR) 500 MG 24 hr tablet Take 1,000 mg by mouth 2 (two) times daily.     methocarbamol (ROBAXIN) 500 MG tablet Take by mouth.     metroNIDAZOLE (METROGEL) 0.75 % gel Apply topically.     Multiple Vitamins-Minerals (MULTIVITAMIN ADULTS 50+) TABS Take by mouth.     nystatin (MYCOSTATIN/NYSTOP) powder Apply 1 g topically 2 (two) times daily as needed (skin irritation in skin fold areas).     Omega-3 Fatty Acids (FISH OIL) 1000 MG CAPS Take 1,000 mg by mouth every evening.     pioglitazone (ACTOS) 15 MG tablet Take by mouth.     predniSONE (DELTASONE) 5 MG tablet Take 5 mg by mouth daily.     ramipril (ALTACE) 10 MG capsule Take 10 mg by mouth 2 (two) times daily.      sildenafil (REVATIO) 20 MG tablet May take up to 5 tabs 1/2 hr before sexual intercourse     simvastatin (ZOCOR) 80 MG tablet Take 40 mg by mouth every evening.      tamsulosin (FLOMAX) 0.4 MG CAPS capsule Take 0.4 mg by mouth daily.     triamcinolone ointment (KENALOG) 0.1 % Apply topically.     XELJANZ 5 MG TABS Take 5 mg by mouth  2 (two) times daily.     linagliptin (TRADJENTA) 5 MG TABS tablet Take 5 mg by mouth daily. (Patient not taking: No sig reported)     No current facility-administered medications for this visit.      Marland Kitchen  PHYSICAL EXAMINATION:   Vitals:   11/03/20 1324  BP: (!) 118/53  Pulse: 76  Resp: 18  Temp: (!) 96.3 F (35.7 C)  SpO2: 98%   Filed Weights   11/03/20 1324  Weight: 214 lb (97.1 kg)    Physical Exam Vitals and nursing note reviewed.  Constitutional:      Comments:  HENT:     Head: Normocephalic and atraumatic.     Mouth/Throat:     Pharynx: Oropharynx is clear.  Eyes:     Extraocular Movements: Extraocular movements intact.     Pupils: Pupils are equal, round, and reactive to light.  Cardiovascular:     Rate and Rhythm: Normal rate and regular rhythm.  Pulmonary:     Comments: Decreased breath sounds bilaterally.  Abdominal:     Palpations: Abdomen is soft.  Musculoskeletal:        General: Normal range of motion.     Cervical back: Normal range of motion.     Comments: Joint deformity suggestive of rheumatoid arthritis.  Skin:    General: Skin is warm.  Neurological:     General: No focal deficit present.     Mental Status: He is alert and oriented to person, place, and time.  Psychiatric:        Behavior: Behavior normal.        Judgment: Judgment normal.     LABORATORY DATA:  I have reviewed the data as listed Lab Results  Component Value Date   WBC 5.9 11/03/2020   HGB 12.7 (L) 11/03/2020   HCT 39.5 11/03/2020   MCV 85.3 11/03/2020   PLT 194 11/03/2020   Recent Labs    08/04/20 1019 11/03/20 1254  NA 137 136  K 4.1 4.4  CL 102 103  CO2 27 25  GLUCOSE 166* 193*  BUN 12 14  CREATININE 0.78 0.75  CALCIUM 9.4 9.0  GFRNONAA >60 >60    RADIOGRAPHIC STUDIES: I have personally reviewed the radiological images as listed and agreed with the findings in the report. No results found.  ASSESSMENT & PLAN:   Iron deficiency #Iron  deficiency anemia-unclear etiology-July 2022 hemoglobin around 10; ferritin-6.  Symptomatic with fatigue.  S/p Venofer x4-hemoglobin today is 12.7.;  Hold IV Venofer today.  Continue oral iron.   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  Guaiac cards were negative. will need GI eval/ imaging. ollow up with PCP re: GI referral; if not wife will call us.  Check UA.  Consider CT scan.  # Severe RA-Dr.Patel.    # DISPOSITION: # UA today # NO venofer today # follow up in 4 months for MD assessment and labs- cbc/bmp;iron studies/ferrtin- possible- Venofer- Dr.B.    All questions were answered. The patient knows to call the clinic with any problems, questions or concerns.    Cammie Sickle, MD 11/03/2020 1:57 PM

## 2020-11-03 NOTE — Progress Notes (Signed)
Pt for follow up, reports has still been taking oral iron.  Denies any concerns.

## 2021-02-23 ENCOUNTER — Other Ambulatory Visit: Payer: Self-pay | Admitting: *Deleted

## 2021-02-23 DIAGNOSIS — D509 Iron deficiency anemia, unspecified: Secondary | ICD-10-CM

## 2021-03-02 ENCOUNTER — Other Ambulatory Visit: Payer: Self-pay

## 2021-03-02 ENCOUNTER — Inpatient Hospital Stay: Payer: Medicare Other | Attending: Internal Medicine

## 2021-03-02 ENCOUNTER — Inpatient Hospital Stay (HOSPITAL_BASED_OUTPATIENT_CLINIC_OR_DEPARTMENT_OTHER): Payer: Medicare Other | Admitting: Internal Medicine

## 2021-03-02 ENCOUNTER — Encounter: Payer: Self-pay | Admitting: Internal Medicine

## 2021-03-02 ENCOUNTER — Inpatient Hospital Stay: Payer: Medicare Other

## 2021-03-02 DIAGNOSIS — M069 Rheumatoid arthritis, unspecified: Secondary | ICD-10-CM | POA: Diagnosis not present

## 2021-03-02 DIAGNOSIS — E611 Iron deficiency: Secondary | ICD-10-CM | POA: Diagnosis not present

## 2021-03-02 DIAGNOSIS — Z7982 Long term (current) use of aspirin: Secondary | ICD-10-CM | POA: Insufficient documentation

## 2021-03-02 DIAGNOSIS — Z79899 Other long term (current) drug therapy: Secondary | ICD-10-CM | POA: Diagnosis not present

## 2021-03-02 DIAGNOSIS — Z794 Long term (current) use of insulin: Secondary | ICD-10-CM | POA: Insufficient documentation

## 2021-03-02 DIAGNOSIS — Z7952 Long term (current) use of systemic steroids: Secondary | ICD-10-CM | POA: Insufficient documentation

## 2021-03-02 DIAGNOSIS — Z87891 Personal history of nicotine dependence: Secondary | ICD-10-CM | POA: Diagnosis not present

## 2021-03-02 DIAGNOSIS — E119 Type 2 diabetes mellitus without complications: Secondary | ICD-10-CM | POA: Diagnosis not present

## 2021-03-02 DIAGNOSIS — I10 Essential (primary) hypertension: Secondary | ICD-10-CM | POA: Diagnosis not present

## 2021-03-02 DIAGNOSIS — D509 Iron deficiency anemia, unspecified: Secondary | ICD-10-CM

## 2021-03-02 LAB — CBC WITH DIFFERENTIAL/PLATELET
Abs Immature Granulocytes: 0.03 10*3/uL (ref 0.00–0.07)
Basophils Absolute: 0 10*3/uL (ref 0.0–0.1)
Basophils Relative: 0 %
Eosinophils Absolute: 0.1 10*3/uL (ref 0.0–0.5)
Eosinophils Relative: 1 %
HCT: 43 % (ref 39.0–52.0)
Hemoglobin: 14.1 g/dL (ref 13.0–17.0)
Immature Granulocytes: 0 %
Lymphocytes Relative: 14 %
Lymphs Abs: 1 10*3/uL (ref 0.7–4.0)
MCH: 29 pg (ref 26.0–34.0)
MCHC: 32.8 g/dL (ref 30.0–36.0)
MCV: 88.5 fL (ref 80.0–100.0)
Monocytes Absolute: 0.4 10*3/uL (ref 0.1–1.0)
Monocytes Relative: 5 %
Neutro Abs: 5.6 10*3/uL (ref 1.7–7.7)
Neutrophils Relative %: 80 %
Platelets: 186 10*3/uL (ref 150–400)
RBC: 4.86 MIL/uL (ref 4.22–5.81)
RDW: 13.2 % (ref 11.5–15.5)
WBC: 7 10*3/uL (ref 4.0–10.5)
nRBC: 0 % (ref 0.0–0.2)

## 2021-03-02 LAB — BASIC METABOLIC PANEL
Anion gap: 9 (ref 5–15)
BUN: 16 mg/dL (ref 8–23)
CO2: 25 mmol/L (ref 22–32)
Calcium: 9.1 mg/dL (ref 8.9–10.3)
Chloride: 99 mmol/L (ref 98–111)
Creatinine, Ser: 0.76 mg/dL (ref 0.61–1.24)
GFR, Estimated: >60 mL/min (ref >60)
Glucose, Bld: 268 mg/dL — ABNORMAL HIGH (ref 70–99)
Potassium: 4.3 mmol/L (ref 3.5–5.1)
Sodium: 133 mmol/L — ABNORMAL LOW (ref 135–145)

## 2021-03-02 LAB — IRON AND TIBC
Iron: 63 ug/dL (ref 45–182)
Saturation Ratios: 16 % — ABNORMAL LOW (ref 17.9–39.5)
TIBC: 391 ug/dL (ref 250–450)
UIBC: 328 ug/dL

## 2021-03-02 LAB — FERRITIN: Ferritin: 28 ng/mL (ref 24–336)

## 2021-03-02 NOTE — Assessment & Plan Note (Signed)
#  Iron deficiency anemia-unclear etiology-July 2022 hemoglobin around 10; ferritin-6.  Symptomatic with fatigue.  S/p Venofer x4-hemoglobin today is 14.0.  Hold IV Venofer today.  Continue oral iron.   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  Guaiac cards were negative. Awaiting GI eval/colonoscopy April, 2023.  # Severe RA-Dr.Patel- STABLE.   # DISPOSITION: # NO venofer today # follow up in 6 months for MD assessment and labs- cbc/bmp;iron studies/ferrtin- possible- Venofer- Dr.B.

## 2021-03-02 NOTE — Progress Notes (Signed)
[name]  Patient Care Team: Kym Groom Guy Begin, MD as PCP - General (Family Medicine)  CHIEF COMPLAINTS/PURPOSE OF CONSULTATION: Iron deficiency anemia #  Oncology History   No history exists.     HISTORY OF PRESENTING ILLNESS: pt in wheel chair sec to severe RA; accompanied by wife.  Samuel Mendoza 81 y.o.  male iron deficient anemia unclear etiology is here for follow-up.  Patient is taking p.o. iron.  Energy levels improving.  Continues to have mild shortness of breath mild fatigue.  No weight loss.  No chest pain.  Denies any blood in urine.  Patient is currently awaiting evaluation with GI in April, 2023.   Review of Systems  Constitutional:  Positive for malaise/fatigue. Negative for chills, diaphoresis, fever and weight loss.  HENT:  Negative for nosebleeds and sore throat.   Eyes:  Negative for double vision.  Respiratory:  Positive for shortness of breath. Negative for cough, hemoptysis, sputum production and wheezing.   Cardiovascular:  Negative for chest pain, palpitations, orthopnea and leg swelling.  Gastrointestinal:  Negative for abdominal pain, blood in stool, constipation, diarrhea, heartburn, melena, nausea and vomiting.  Genitourinary:  Negative for dysuria, frequency and urgency.  Musculoskeletal:  Negative for back pain and joint pain.  Skin: Negative.  Negative for itching and rash.  Neurological:  Negative for dizziness, tingling, focal weakness, weakness and headaches.  Endo/Heme/Allergies:  Does not bruise/bleed easily.  Psychiatric/Behavioral:  Negative for depression. The patient is not nervous/anxious and does not have insomnia.     MEDICAL HISTORY:  Past Medical History:  Diagnosis Date   Arthritis    Asbestos exposure    COPD (chronic obstructive pulmonary disease) ([place])    COPD, moderate ([place])    Diabetes mellitus without complication ([place])    Dyslipidemia    ED (erectile dysfunction)    Hypertension     IDA (iron deficiency anemia)    Obstructive sleep apnea    Renal disorder    Rheumatoid arthritis of multiple sites with negative rheumatoid factor ([place])     SURGICAL HISTORY: Past Surgical History:  Procedure Laterality Date   AMPUTATION TOE Right 04/20/2018   Procedure: AMPUTATION TOE MPJ/RT 3RD TOE;  Surgeon: Sharlotte Alamo, DPM;  Location: [facility] ORS;  Service: Podiatry;  Laterality: Right;   JOINT REPLACEMENT Right 2008    SOCIAL HISTORY: Social History   Socioeconomic History   Marital status: Married    Spouse name: Not on file   Number of children: Not on file   Years of education: Not on file   Highest education level: Not on file  Occupational History   Not on file  Tobacco Use   Smoking status: Former    Packs/day: 1.00    Types: Cigarettes   Smokeless tobacco: Never   Tobacco comments:    Quit: 01/11/1987  Vaping Use   Vaping Use: Never used  Substance and Sexual Activity   Alcohol use: Not Currently   Drug use: Never   Sexual activity: Not on file  Other Topics Concern   Not on file  Social History Narrative   Not on file   Social Determinants of Health   Financial Resource Strain: Not on file  Food Insecurity: Not on file  Transportation Needs: Not on file  Physical Activity: Not on file  Stress: Not on file  Social Connections: Not on file  Intimate Partner Violence: Not on file    FAMILY HISTORY: No family history on file.  ALLERGIES:  has No Known Allergies.  MEDICATIONS:  Current Outpatient Medications  Medication Sig Dispense Refill   acetaminophen (TYLENOL) 650 MG CR tablet      acetaminophen-codeine (TYLENOL #4) 300-60 MG tablet Take by mouth.     albuterol (PROVENTIL HFA;VENTOLIN HFA) 108 (90 Base) MCG/ACT inhaler Inhale 1-2 puffs into the lungs every 6 (six) hours as needed for wheezing or shortness of breath.     aspirin EC 81 MG tablet Take 81 mg by mouth daily.     Calcium Carb-Cholecalciferol (CALCIUM 600+D3 PO) Take 1 tablet by  mouth 2 (two) times daily.     clotrimazole-betamethasone (LOTRISONE) cream Apply 1 application topically daily.     diclofenac Sodium (VOLTAREN) 1 % GEL Apply topically 4 (four) times daily.     doxycycline (PERIOSTAT) 20 MG tablet Take 1 tablet 2x/day with food to treat rash on face     famotidine (PEPCID) 10 MG tablet Take 10 mg by mouth 2 (two) times daily as needed for heartburn or indigestion.     ferrous sulfate 325 (65 FE) MG tablet Take 1 tablet by mouth daily with breakfast.     glipiZIDE (GLUCOTROL) 5 MG tablet Take 5-10 mg by mouth See admin instructions. Take 2 tablets (10 mg) by mouth in the morning & take 1 tablet (5 mg) by mouth in the evening.     Insulin Pen Needle (B-D ULTRAFINE III SHORT PEN) 31G X 8 MM MISC as directed USE AS DIRECTED     LANTUS SOLOSTAR 100 UNIT/ML Solostar Pen Inject 35 Units into the skin every evening.     linagliptin (TRADJENTA) 5 MG TABS tablet Take 5 mg by mouth daily.     metFORMIN (GLUCOPHAGE-XR) 500 MG 24 hr tablet Take 1,000 mg by mouth 2 (two) times daily.     methocarbamol (ROBAXIN) 500 MG tablet Take by mouth.     metroNIDAZOLE (METROGEL) 0.75 % gel Apply topically.     Multiple Vitamins-Minerals (MULTIVITAMIN ADULTS 50+) TABS Take by mouth.     nystatin (MYCOSTATIN/NYSTOP) powder Apply 1 g topically 2 (two) times daily as needed (skin irritation in skin fold areas).     Omega-3 Fatty Acids (FISH OIL) 1000 MG CAPS Take 1,000 mg by mouth every evening.     predniSONE (DELTASONE) 5 MG tablet Take 5 mg by mouth daily.     ramipril (ALTACE) 10 MG capsule Take 10 mg by mouth 2 (two) times daily.      sildenafil (REVATIO) 20 MG tablet May take up to 5 tabs 1/2 hr before sexual intercourse     simvastatin (ZOCOR) 80 MG tablet Take 40 mg by mouth every evening.      tamsulosin (FLOMAX) 0.4 MG CAPS capsule Take 0.4 mg by mouth daily.     triamcinolone ointment (KENALOG) 0.1 % Apply topically.     XELJANZ 5 MG TABS Take 5 mg by mouth 2 (two) times  daily.     pioglitazone (ACTOS) 15 MG tablet Take by mouth.     No current facility-administered medications for this visit.      Marland Kitchen  PHYSICAL EXAMINATION:   Vitals:   03/02/21 1304  BP: (!) 119/58  Pulse: 99  Temp: (!) 96.8 F (36 C)  SpO2: 99%   Filed Weights    Physical Exam Vitals and nursing note reviewed.  Constitutional:      Comments:    HENT:     Head: Normocephalic and atraumatic.     Mouth/Throat:  Pharynx: Oropharynx is clear.  Eyes:     Extraocular Movements: Extraocular movements intact.     Pupils: Pupils are equal, round, and reactive to light.  Cardiovascular:     Rate and Rhythm: Normal rate and regular rhythm.  Pulmonary:     Comments: Decreased breath sounds bilaterally.  Abdominal:     Palpations: Abdomen is soft.  Musculoskeletal:        General: Normal range of motion.     Cervical back: Normal range of motion.     Comments: Joint deformity suggestive of rheumatoid arthritis.  Skin:    General: Skin is warm.  Neurological:     General: No focal deficit present.     Mental Status: He is alert and oriented to person, place, and time.  Psychiatric:        Behavior: Behavior normal.        Judgment: Judgment normal.     LABORATORY DATA:  I have reviewed the data as listed Lab Results  Component Value Date   WBC 7.0 03/02/2021   HGB 14.1 03/02/2021   HCT 43.0 03/02/2021   MCV 88.5 03/02/2021   PLT 186 03/02/2021   Recent Labs    08/04/20 1019 11/03/20 1254 03/02/21 1249  NA 137 136 133*  K 4.1 4.4 4.3  CL 102 103 99  CO2 27 25 25   GLUCOSE 166* 193* 268*  BUN 12 14 16   CREATININE 0.78 0.75 0.76  CALCIUM 9.4 9.0 9.1  GFRNONAA >60 >60 >60    RADIOGRAPHIC STUDIES: I have personally reviewed the radiological images as listed and agreed with the findings in the report. No results found.  ASSESSMENT & PLAN:   Iron deficiency #Iron deficiency anemia-unclear etiology-July 2022 hemoglobin around 10; ferritin-6.   Symptomatic with fatigue.  S/p Venofer x4-hemoglobin today is 14.0.  Hold IV Venofer today.  Continue oral iron.   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  Guaiac cards were negative. Awaiting GI eval/colonoscopy April, 2023.  # Severe RA-Dr.Patel- STABLE.    # DISPOSITION: # NO venofer today # follow up in 6 months for MD assessment and labs- cbc/bmp;iron studies/ferrtin- possible- Venofer- Dr.B.    All questions were answered. The patient knows to call the clinic with any problems, questions or concerns.    Cammie Sickle, MD 03/02/2021 1:27 PM

## 2021-07-19 ENCOUNTER — Encounter: Payer: Self-pay | Admitting: *Deleted

## 2021-07-20 ENCOUNTER — Ambulatory Visit: Payer: Medicare Other | Admitting: Anesthesiology

## 2021-07-20 ENCOUNTER — Encounter: Payer: Self-pay | Admitting: *Deleted

## 2021-07-20 ENCOUNTER — Encounter
Admission: RE | Disposition: A | Discharge status: Home / Self Care | Payer: Self-pay | Source: Ambulatory Visit | Attending: Gastroenterology

## 2021-07-20 ENCOUNTER — Ambulatory Visit
Admission: RE | Admit: 2021-07-20 | Discharge: 2021-07-20 | Disposition: A | Discharge status: Home / Self Care | Payer: Medicare Other | Source: Ambulatory Visit | Attending: Gastroenterology | Admitting: Gastroenterology

## 2021-07-20 ENCOUNTER — Other Ambulatory Visit: Payer: Self-pay

## 2021-07-20 DIAGNOSIS — G4733 Obstructive sleep apnea (adult) (pediatric): Secondary | ICD-10-CM | POA: Insufficient documentation

## 2021-07-20 DIAGNOSIS — E119 Type 2 diabetes mellitus without complications: Secondary | ICD-10-CM | POA: Insufficient documentation

## 2021-07-20 DIAGNOSIS — E785 Hyperlipidemia, unspecified: Secondary | ICD-10-CM | POA: Insufficient documentation

## 2021-07-20 DIAGNOSIS — K449 Diaphragmatic hernia without obstruction or gangrene: Secondary | ICD-10-CM | POA: Diagnosis not present

## 2021-07-20 DIAGNOSIS — Z7982 Long term (current) use of aspirin: Secondary | ICD-10-CM | POA: Insufficient documentation

## 2021-07-20 DIAGNOSIS — K2970 Gastritis, unspecified, without bleeding: Secondary | ICD-10-CM | POA: Insufficient documentation

## 2021-07-20 DIAGNOSIS — K31A0 Gastric intestinal metaplasia, unspecified: Secondary | ICD-10-CM | POA: Insufficient documentation

## 2021-07-20 DIAGNOSIS — K5730 Diverticulosis of large intestine without perforation or abscess without bleeding: Secondary | ICD-10-CM | POA: Insufficient documentation

## 2021-07-20 DIAGNOSIS — K3189 Other diseases of stomach and duodenum: Secondary | ICD-10-CM | POA: Insufficient documentation

## 2021-07-20 DIAGNOSIS — D509 Iron deficiency anemia, unspecified: Secondary | ICD-10-CM | POA: Insufficient documentation

## 2021-07-20 DIAGNOSIS — M069 Rheumatoid arthritis, unspecified: Secondary | ICD-10-CM | POA: Insufficient documentation

## 2021-07-20 DIAGNOSIS — D175 Benign lipomatous neoplasm of intra-abdominal organs: Secondary | ICD-10-CM | POA: Diagnosis not present

## 2021-07-20 DIAGNOSIS — Z87891 Personal history of nicotine dependence: Secondary | ICD-10-CM | POA: Diagnosis not present

## 2021-07-20 DIAGNOSIS — J449 Chronic obstructive pulmonary disease, unspecified: Secondary | ICD-10-CM | POA: Diagnosis not present

## 2021-07-20 DIAGNOSIS — K2980 Duodenitis without bleeding: Secondary | ICD-10-CM | POA: Diagnosis not present

## 2021-07-20 DIAGNOSIS — K640 First degree hemorrhoids: Secondary | ICD-10-CM | POA: Insufficient documentation

## 2021-07-20 DIAGNOSIS — B9681 Helicobacter pylori [H. pylori] as the cause of diseases classified elsewhere: Secondary | ICD-10-CM | POA: Insufficient documentation

## 2021-07-20 HISTORY — PX: COLONOSCOPY WITH PROPOFOL: SHX5780

## 2021-07-20 HISTORY — PX: ESOPHAGOGASTRODUODENOSCOPY (EGD) WITH PROPOFOL: SHX5813

## 2021-07-20 LAB — GLUCOSE, CAPILLARY: Glucose-Capillary: 98 mg/dL (ref 70–99)

## 2021-07-20 SURGERY — COLONOSCOPY WITH PROPOFOL
Anesthesia: General

## 2021-07-20 MED ORDER — SODIUM CHLORIDE 0.9 % IV SOLN: INTRAVENOUS | Status: DC

## 2021-07-20 MED ORDER — PROPOFOL 1000 MG/100ML IV EMUL
INTRAVENOUS | Status: AC
  Filled 2021-07-20: qty 100

## 2021-07-20 MED ORDER — PROPOFOL 10 MG/ML IV BOLUS
INTRAVENOUS | Status: DC | PRN
  Administered 2021-07-20: 50 mg via INTRAVENOUS

## 2021-07-20 MED ORDER — PHENYLEPHRINE HCL (PRESSORS) 10 MG/ML IV SOLN
INTRAVENOUS | Status: AC
  Filled 2021-07-20: qty 1

## 2021-07-20 MED ORDER — PROPOFOL 500 MG/50ML IV EMUL
INTRAVENOUS | Status: DC | PRN
  Administered 2021-07-20: 200 ug/kg/min via INTRAVENOUS

## 2021-07-20 NOTE — Op Note (Signed)
[facility] Patient Name: Samuel Mendoza Procedure Date: 07/20/2021 7:07 AM MRN: [id number] Account #: [id number] Date of Birth: 11-04-1940 Admit Type: Outpatient Age: 81 Room: [facility] ENDO ROOM 1 Gender: Male Note Status: Finalized Instrument Name: Colonoscope [id number] Procedure:             Colonoscopy Indications:           Iron deficiency anemia Providers:             Andrey Farmer MD, MD Referring MD:          Wynona Canes. Kym Groom, MD (Referring MD) Medicines:             Monitored Anesthesia Care Complications:         No immediate complications. Procedure:             Pre-Anesthesia Assessment:                        - Prior to the procedure, a History and Physical was                         performed, and patient medications and allergies were                         reviewed. The patient is competent. The risks and                         benefits of the procedure and the sedation options and                         risks were discussed with the patient. All questions                         were answered and informed consent was obtained.                         Patient identification and proposed procedure were                         verified by the physician, the nurse, the                         anesthesiologist, the anesthetist and the technician                         in the endoscopy suite. Mental Status Examination:                         alert and oriented. Airway Examination: normal                         oropharyngeal airway and neck mobility. Respiratory                         Examination: clear to auscultation. CV Examination:                         normal. Prophylactic Antibiotics: The patient does not  require prophylactic antibiotics. Prior                         Anticoagulants: The patient has taken no previous                         anticoagulant or antiplatelet agents except for                          aspirin. ASA Grade Assessment: III - A patient with                         severe systemic disease. After reviewing the risks and                         benefits, the patient was deemed in satisfactory                         condition to undergo the procedure. The anesthesia                         plan was to use monitored anesthesia care (MAC).                         Immediately prior to administration of medications,                         the patient was re-assessed for adequacy to receive                         sedatives. The heart rate, respiratory rate, oxygen                         saturations, blood pressure, adequacy of pulmonary                         ventilation, and response to care were monitored                         throughout the procedure. The physical status of the                         patient was re-assessed after the procedure.                        After obtaining informed consent, the colonoscope was                         passed under direct vision. Throughout the procedure,                         the patient's blood pressure, pulse, and oxygen                         saturations were monitored continuously. The                         Colonoscope was introduced through the anus and  advanced to the the terminal ileum. The colonoscopy                         was performed without difficulty. The patient                         tolerated the procedure well. The quality of the bowel                         preparation was good. Findings:      The perianal and digital rectal examinations were normal.      The terminal ileum appeared normal.      There was a medium-sized lipoma, in the transverse colon.      Scattered small-mouthed diverticula were found in the sigmoid colon and       descending colon.      Internal hemorrhoids were found during retroflexion. The hemorrhoids       were Grade I (internal hemorrhoids that  do not prolapse).      The exam was otherwise without abnormality on direct and retroflexion       views. Impression:            - The examined portion of the ileum was normal.                        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                        - Diverticulosis in the sigmoid colon and in the                         descending colon.                        - Internal hemorrhoids.                        - The examination was otherwise normal on direct and                         retroflexion views.                        - No specimens collected. Recommendation:        - Discharge patient to home.                        - Resume previous diet.                        - Continue present medications.                        - Repeat colonoscopy is not recommended due to current                         age (91 years or older) for surveillance.                        - Return to referring physician as previously  scheduled. Procedure Code(s):     --- Professional ---                        [phone], Colonoscopy, flexible; diagnostic, including                         collection of specimen(s) by brushing or washing, when                         performed (separate procedure) Diagnosis Code(s):     --- Professional ---                        K64.0, First degree hemorrhoids                        D17.5, Benign lipomatous neoplasm of intra-abdominal                         organs                        D50.9, Iron deficiency anemia, unspecified                        K57.30, Diverticulosis of large intestine without                         perforation or abscess without bleeding CPT copyright 2019 [employer]. All rights reserved. The codes documented in this report are preliminary and upon coder review may  be revised to meet current compliance requirements. Andrey Farmer MD, MD 07/20/2021 8:21:47 AM Number of Addenda: 0 Note  Initiated On: 07/20/2021 7:07 AM Scope Withdrawal Time: 0 hours 10 minutes 46 seconds  Total Procedure Duration: 0 hours 16 minutes 34 seconds  Estimated Blood Loss:  Estimated blood loss: none.      [facility]

## 2021-07-20 NOTE — Anesthesia Preprocedure Evaluation (Signed)
Anesthesia Evaluation  Patient identified by MRN, date of birth, ID band Patient awake    Reviewed: Allergy & Precautions, NPO status , Patient's Chart, lab work & pertinent test results  History of Anesthesia Complications Negative for: history of anesthetic complications  Airway Mallampati: III  TM Distance: <3 FB Neck ROM: full    Dental  (+) Poor Dentition, Missing   Pulmonary sleep apnea , COPD, former smoker,    Pulmonary exam normal        Cardiovascular Exercise Tolerance: Good hypertension, (-) anginaNormal cardiovascular exam     Neuro/Psych negative neurological ROS  negative psych ROS   GI/Hepatic negative GI ROS, Neg liver ROS, neg GERD  ,  Endo/Other  diabetes, Type 2  Renal/GU Renal disease  negative genitourinary   Musculoskeletal   Abdominal   Peds  Hematology negative hematology ROS (+)   Anesthesia Other Findings Past Medical History: No date: Arthritis No date: Asbestos exposure No date: COPD (chronic obstructive pulmonary disease) (HCC) No date: COPD, moderate (HCC) No date: Diabetes mellitus without complication (HCC) No date: Dyslipidemia No date: ED (erectile dysfunction) No date: Hypertension No date: IDA (iron deficiency anemia) No date: Obstructive sleep apnea No date: Renal disorder No date: Rheumatoid arthritis of multiple sites with negative  rheumatoid factor ([place])  Past Surgical History: 04/20/2018: AMPUTATION TOE; Right     Comment:  Procedure: AMPUTATION TOE MPJ/RT 3RD TOE;  Surgeon:               Sharlotte Alamo, DPM;  Location: [facility] ORS;  Service:               Podiatry;  Laterality: Right; No date: APPENDECTOMY No date: COLONOSCOPY No date: CYSTECTOMY No date: HERNIA REPAIR 2008: JOINT REPLACEMENT; Right No date: KNEE ARTHROSCOPY No date: MANDIBLE FRACTURE SURGERY No date: UPPER GI ENDOSCOPY  BMI    Body Mass Index: 29.84 kg/m       Reproductive/Obstetrics negative OB ROS                             Anesthesia Physical Anesthesia Plan  ASA: 3  Anesthesia Plan: General   Post-op Pain Management:    Induction: Intravenous  PONV Risk Score and Plan: Propofol infusion and TIVA  Airway Management Planned: Natural Airway and Nasal Cannula  Additional Equipment:   Intra-op Plan:   Post-operative Plan:   Informed Consent: I have reviewed the patients History and Physical, chart, labs and discussed the procedure including the risks, benefits and alternatives for the proposed anesthesia with the patient or authorized representative who has indicated his/her understanding and acceptance.     Dental Advisory Given  Plan Discussed with: Anesthesiologist, CRNA and Surgeon  Anesthesia Plan Comments: (Patient consented for risks of anesthesia including but not limited to:  - adverse reactions to medications - risk of airway placement if required - damage to eyes, teeth, lips or other oral mucosa - nerve damage due to positioning  - sore throat or hoarseness - Damage to heart, brain, nerves, lungs, other parts of body or loss of life  Patient voiced understanding.)        Anesthesia Quick Evaluation

## 2021-07-20 NOTE — Transfer of Care (Signed)
Immediate Anesthesia Transfer of Care Note  Patient: Samuel Mendoza  Procedure(s) Performed: COLONOSCOPY WITH PROPOFOL ESOPHAGOGASTRODUODENOSCOPY (EGD) WITH PROPOFOL  Patient Location: PACU  Anesthesia Type:General  Level of Consciousness: awake, alert  and oriented  Airway & Oxygen Therapy: Patient Spontanous Breathing and Patient connected to nasal cannula oxygen  Post-op Assessment: Report given to RN and Post -op Vital signs reviewed and stable  Post vital signs: Reviewed and stable  Last Vitals:  Vitals Value Taken Time  BP 107/53 07/20/21 0820  Temp    Pulse 48 07/20/21 0821  Resp 20 07/20/21 0821  SpO2 94 % 07/20/21 0821  Vitals shown include unvalidated device data.  Last Pain:  Vitals:   07/20/21 0820  TempSrc:   PainSc: 0-No pain         Complications: No notable events documented.

## 2021-07-20 NOTE — Interval H&P Note (Signed)
History and Physical Interval Note:  07/20/2021 7:43 AM  Samuel Mendoza  has presented today for surgery, with the diagnosis of IDA.  The various methods of treatment have been discussed with the patient and family. After consideration of risks, benefits and other options for treatment, the patient has consented to  Procedure(s) with comments: COLONOSCOPY WITH PROPOFOL (N/A) - IDDM ESOPHAGOGASTRODUODENOSCOPY (EGD) WITH PROPOFOL (N/A) as a surgical intervention.  The patient's history has been reviewed, patient examined, no change in status, stable for surgery.  I have reviewed the patient's chart and labs.  Questions were answered to the patient's satisfaction.     Lesly Rubenstein  Ok to proceed with EGD/Colonoscopy

## 2021-07-20 NOTE — Anesthesia Procedure Notes (Signed)
Date/Time: 07/20/2021 7:51 AM  Performed by: Nelda Marseille, CRNAPre-anesthesia Checklist: Patient identified, Emergency Drugs available, Suction available, Patient being monitored and Timeout performed Oxygen Delivery Method: Nasal cannula

## 2021-07-20 NOTE — H&P (Signed)
Outpatient short stay form Pre-procedure 07/20/2021  Samuel Rubenstein, MD  Primary Physician: Valera Castle, MD  Reason for visit:  IDA  History of present illness:    81 y/o gentleman with history of DM II, RA, and HLD here for EGD/Colonoscopy for IDA. Last colonoscopy in 2016 with 4 small Ta's. No family history of GI malignancies. No blood thinners except aspirin. History of appendectomy and inguinal hernia repair.    Current Facility-Administered Medications:    0.9 %  sodium chloride infusion, , Intravenous, Continuous, Mahrukh Seguin, Hilton Cork, MD, Last Rate: 20 mL/hr at 07/20/21 0719, New Bag at 07/20/21 0719  Medications Prior to Admission  Medication Sig Dispense Refill Last Dose   glipiZIDE (GLUCOTROL) 5 MG tablet Take 5-10 mg by mouth See admin instructions. Take 2 tablets (10 mg) by mouth in the morning & take 1 tablet (5 mg) by mouth in the evening.   07/19/2021   LANTUS SOLOSTAR 100 UNIT/ML Solostar Pen Inject 35 Units into the skin every evening.   07/19/2021   linagliptin (TRADJENTA) 5 MG TABS tablet Take 5 mg by mouth daily.   07/19/2021   metFORMIN (GLUCOPHAGE-XR) 500 MG 24 hr tablet Take 1,000 mg by mouth 2 (two) times daily.   07/19/2021   predniSONE (DELTASONE) 5 MG tablet Take 5 mg by mouth daily.   07/19/2021   ranitidine (ZANTAC) 150 MG capsule Take 150 mg by mouth 2 (two) times daily.      tamsulosin (FLOMAX) 0.4 MG CAPS capsule Take 0.4 mg by mouth daily.   07/19/2021   acetaminophen (TYLENOL) 650 MG CR tablet       acetaminophen-codeine (TYLENOL #4) 300-60 MG tablet Take by mouth.      albuterol (PROVENTIL HFA;VENTOLIN HFA) 108 (90 Base) MCG/ACT inhaler Inhale 1-2 puffs into the lungs every 6 (six) hours as needed for wheezing or shortness of breath.      aspirin EC 81 MG tablet Take 81 mg by mouth daily.      Calcium Carb-Cholecalciferol (CALCIUM 600+D3 PO) Take 1 tablet by mouth 2 (two) times daily.      clotrimazole-betamethasone (LOTRISONE) cream Apply 1  application topically daily.      diclofenac Sodium (VOLTAREN) 1 % GEL Apply topically 4 (four) times daily.      doxycycline (PERIOSTAT) 20 MG tablet Take 1 tablet 2x/day with food to treat rash on face      famotidine (PEPCID) 10 MG tablet Take 10 mg by mouth 2 (two) times daily as needed for heartburn or indigestion.      ferrous sulfate 325 (65 FE) MG tablet Take 1 tablet by mouth daily with breakfast.      Insulin Pen Needle (B-D ULTRAFINE III SHORT PEN) 31G X 8 MM MISC as directed USE AS DIRECTED      methocarbamol (ROBAXIN) 500 MG tablet Take by mouth.      Multiple Vitamins-Minerals (MULTIVITAMIN ADULTS 50+) TABS Take by mouth.      nystatin (MYCOSTATIN/NYSTOP) powder Apply 1 g topically 2 (two) times daily as needed (skin irritation in skin fold areas).      Omega-3 Fatty Acids (FISH OIL) 1000 MG CAPS Take 1,000 mg by mouth every evening.      pioglitazone (ACTOS) 15 MG tablet Take by mouth.      ramipril (ALTACE) 10 MG capsule Take 10 mg by mouth 2 (two) times daily.       sildenafil (REVATIO) 20 MG tablet May take up to 5 tabs 1/2 hr  before sexual intercourse      simvastatin (ZOCOR) 80 MG tablet Take 40 mg by mouth every evening.       XELJANZ 5 MG TABS Take 5 mg by mouth 2 (two) times daily.        No Known Allergies   Past Medical History:  Diagnosis Date   Arthritis    Asbestos exposure    COPD (chronic obstructive pulmonary disease) (HCC)    COPD, moderate ([place])    Diabetes mellitus without complication ([place])    Dyslipidemia    ED (erectile dysfunction)    Hypertension    IDA (iron deficiency anemia)    Obstructive sleep apnea    Renal disorder    Rheumatoid arthritis of multiple sites with negative rheumatoid factor ([place])     Review of systems:  Otherwise negative.    Physical Exam  Gen: Alert, oriented. Appears stated age.  HEENT: PERRLA. Lungs: No respiratory distress CV: RRR Abd: soft, benign, no masses Ext: No edema    Planned procedures:  Proceed with EGD/colonoscopy. The patient understands the nature of the planned procedure, indications, risks, alternatives and potential complications including but not limited to bleeding, infection, perforation, damage to internal organs and possible oversedation/side effects from anesthesia. The patient agrees and gives consent to proceed.  Please refer to procedure notes for findings, recommendations and patient disposition/instructions.     Samuel Rubenstein, MD [facility]

## 2021-07-20 NOTE — Anesthesia Postprocedure Evaluation (Signed)
Anesthesia Post Note  Patient: Samuel Mendoza  Procedure(s) Performed: COLONOSCOPY WITH PROPOFOL ESOPHAGOGASTRODUODENOSCOPY (EGD) WITH PROPOFOL  Patient location during evaluation: Endoscopy Anesthesia Type: General Level of consciousness: awake and alert Pain management: pain level controlled Vital Signs Assessment: post-procedure vital signs reviewed and stable Respiratory status: spontaneous breathing, nonlabored ventilation, respiratory function stable and patient connected to nasal cannula oxygen Cardiovascular status: blood pressure returned to baseline and stable Postop Assessment: no apparent nausea or vomiting Anesthetic complications: no   No notable events documented.   Last Vitals:  Vitals:   07/20/21 0830 07/20/21 0840  BP: 118/66 118/72  Pulse: 74   Resp: 13 (!) 25  Temp:    SpO2: 100% 100%    Last Pain:  Vitals:   07/20/21 0820  TempSrc:   PainSc: 0-No pain                 [name] Alyvia Derk

## 2021-07-20 NOTE — Op Note (Signed)
[facility] Patient Name: Samuel Mendoza Procedure Date: 07/20/2021 7:09 AM MRN: [id number] Account #: [id number] Date of Birth: [birth year] Admit Type: Outpatient Age: 81 Room: [facility] ENDO ROOM 1 Gender: Male Note Status: Finalized Instrument Name: Altamese Cabal Endoscope [id number] Procedure:             Upper GI endoscopy Indications:           Iron deficiency anemia Providers:             Andrey Farmer MD, MD Referring MD:          Wynona Canes. Kym Groom, MD (Referring MD) Medicines:             Monitored Anesthesia Care Complications:         No immediate complications. Estimated blood loss:                         Minimal. Procedure:             Pre-Anesthesia Assessment:                        - Prior to the procedure, a History and Physical was                         performed, and patient medications and allergies were                         reviewed. The patient is competent. The risks and                         benefits of the procedure and the sedation options and                         risks were discussed with the patient. All questions                         were answered and informed consent was obtained.                         Patient identification and proposed procedure were                         verified by the physician, the nurse, the                         anesthesiologist, the anesthetist and the technician                         in the endoscopy suite. Mental Status Examination:                         alert and oriented. Airway Examination: normal                         oropharyngeal airway and neck mobility. Respiratory                         Examination: clear to auscultation. CV Examination:  normal. Prophylactic Antibiotics: The patient does not                         require prophylactic antibiotics. Prior                         Anticoagulants: The patient has taken no previous                          anticoagulant or antiplatelet agents except for                         aspirin. ASA Grade Assessment: III - A patient with                         severe systemic disease. After reviewing the risks and                         benefits, the patient was deemed in satisfactory                         condition to undergo the procedure. The anesthesia                         plan was to use monitored anesthesia care (MAC).                         Immediately prior to administration of medications,                         the patient was re-assessed for adequacy to receive                         sedatives. The heart rate, respiratory rate, oxygen                         saturations, blood pressure, adequacy of pulmonary                         ventilation, and response to care were monitored                         throughout the procedure. The physical status of the                         patient was re-assessed after the procedure.                        After obtaining informed consent, the endoscope was                         passed under direct vision. Throughout the procedure,                         the patient's blood pressure, pulse, and oxygen                         saturations were monitored continuously. The Endoscope  was introduced through the mouth, and advanced to the                         second part of duodenum. The upper GI endoscopy was                         accomplished without difficulty. The patient tolerated                         the procedure well. Findings:      A 2 cm hiatal hernia was present.      The exam of the esophagus was otherwise normal.      Patchy mild inflammation characterized by erythema and granularity was       found in the stomach.      A few localized, diminutive erosions without bleeding were found in the       duodenal bulb. Biopsies were taken with a cold forceps for histology.       Estimated blood loss was  minimal.      The exam of the duodenum was otherwise normal. Impression:            - 2 cm hiatal hernia.                        - Gastritis.                        - Erosive duodenopathy without bleeding. Biopsied. Recommendation:        - Await pathology results.                        - Perform a colonoscopy today. Procedure Code(s):     --- Professional ---                        [phone], Esophagogastroduodenoscopy, flexible,                         transoral; with biopsy, single or multiple Diagnosis Code(s):     --- Professional ---                        K44.9, Diaphragmatic hernia without obstruction or                         gangrene                        K29.70, Gastritis, unspecified, without bleeding                        K31.89, Other diseases of stomach and duodenum                        D50.9, Iron deficiency anemia, unspecified CPT copyright 2019 [employer]. All rights reserved. The codes documented in this report are preliminary and upon coder review may  be revised to meet current compliance requirements. Andrey Farmer MD, MD 07/20/2021 8:19:15 AM Number of Addenda: 0 Note Initiated On: 07/20/2021 7:09 AM Estimated Blood Loss:  Estimated blood loss was minimal.      [facility]

## 2021-07-21 ENCOUNTER — Encounter: Payer: Self-pay | Admitting: Gastroenterology

## 2021-07-21 LAB — SURGICAL PATHOLOGY

## 2021-08-27 MED FILL — Iron Sucrose Inj 20 MG/ML (Fe Equiv): INTRAVENOUS | Qty: 10 | Status: AC

## 2021-08-30 ENCOUNTER — Inpatient Hospital Stay: Payer: Medicare Other

## 2021-08-30 ENCOUNTER — Inpatient Hospital Stay (HOSPITAL_BASED_OUTPATIENT_CLINIC_OR_DEPARTMENT_OTHER): Payer: Medicare Other | Admitting: Internal Medicine

## 2021-08-30 ENCOUNTER — Inpatient Hospital Stay: Payer: Medicare Other | Attending: Internal Medicine

## 2021-08-30 ENCOUNTER — Encounter: Payer: Self-pay | Admitting: Internal Medicine

## 2021-08-30 DIAGNOSIS — D509 Iron deficiency anemia, unspecified: Secondary | ICD-10-CM | POA: Insufficient documentation

## 2021-08-30 DIAGNOSIS — E611 Iron deficiency: Secondary | ICD-10-CM

## 2021-08-30 DIAGNOSIS — Z9049 Acquired absence of other specified parts of digestive tract: Secondary | ICD-10-CM | POA: Diagnosis not present

## 2021-08-30 DIAGNOSIS — Z87891 Personal history of nicotine dependence: Secondary | ICD-10-CM | POA: Diagnosis not present

## 2021-08-30 DIAGNOSIS — Z79899 Other long term (current) drug therapy: Secondary | ICD-10-CM | POA: Diagnosis not present

## 2021-08-30 LAB — CBC WITH DIFFERENTIAL/PLATELET
Abs Immature Granulocytes: 0.01 10*3/uL (ref 0.00–0.07)
Basophils Absolute: 0 10*3/uL (ref 0.0–0.1)
Basophils Relative: 0 %
Eosinophils Absolute: 0.1 10*3/uL (ref 0.0–0.5)
Eosinophils Relative: 2 %
HCT: 42.3 % (ref 39.0–52.0)
Hemoglobin: 13.8 g/dL (ref 13.0–17.0)
Immature Granulocytes: 0 %
Lymphocytes Relative: 15 %
Lymphs Abs: 0.9 10*3/uL (ref 0.7–4.0)
MCH: 28.8 pg (ref 26.0–34.0)
MCHC: 32.6 g/dL (ref 30.0–36.0)
MCV: 88.1 fL (ref 80.0–100.0)
Monocytes Absolute: 0.3 10*3/uL (ref 0.1–1.0)
Monocytes Relative: 5 %
Neutro Abs: 4.9 10*3/uL (ref 1.7–7.7)
Neutrophils Relative %: 78 %
Platelets: 198 10*3/uL (ref 150–400)
RBC: 4.8 MIL/uL (ref 4.22–5.81)
RDW: 13.3 % (ref 11.5–15.5)
WBC: 6.2 10*3/uL (ref 4.0–10.5)
nRBC: 0 % (ref 0.0–0.2)

## 2021-08-30 LAB — IRON AND TIBC
Iron: 79 ug/dL (ref 45–182)
Saturation Ratios: 22 % (ref 17.9–39.5)
TIBC: 358 ug/dL (ref 250–450)
UIBC: 279 ug/dL

## 2021-08-30 LAB — BASIC METABOLIC PANEL
Anion gap: 8 (ref 5–15)
BUN: 13 mg/dL (ref 8–23)
CO2: 26 mmol/L (ref 22–32)
Calcium: 9.2 mg/dL (ref 8.9–10.3)
Chloride: 102 mmol/L (ref 98–111)
Creatinine, Ser: 0.7 mg/dL (ref 0.61–1.24)
GFR, Estimated: >60 mL/min (ref >60)
Glucose, Bld: 279 mg/dL — ABNORMAL HIGH (ref 70–99)
Potassium: 4.4 mmol/L (ref 3.5–5.1)
Sodium: 136 mmol/L (ref 135–145)

## 2021-08-30 LAB — FERRITIN: Ferritin: 29 ng/mL (ref 24–336)

## 2021-08-30 NOTE — Progress Notes (Signed)
[place] NOTE  Patient Care Team: Valera Castle, MD as PCP - General (Family Medicine) Cammie Sickle, MD as Consulting Physician (Oncology)  CHIEF COMPLAINTS/PURPOSE OF CONSULTATION: Iron deficiency anemia #  Oncology History   No history exists.     HISTORY OF PRESENTING ILLNESS: pt in wheel chair sec to severe RA; accompanied by wife.  Samuel Mendoza 81 y.o.  male iron deficient anemia unclear etiology is here for follow-up.  In the interim patient underwent EGD/colonoscopy in July 2023.  S/p treatment with H. pylori antibiotic for duodenitis.  Patient is taking p.o. iron.  Energy levels improving.  Continues to have mild shortness of breath mild fatigue.  No weight loss.  No chest pain.  Denies any blood in urine.  Review of Systems  Constitutional:  Positive for malaise/fatigue. Negative for chills, diaphoresis, fever and weight loss.  HENT:  Negative for nosebleeds and sore throat.   Eyes:  Negative for double vision.  Respiratory:  Positive for shortness of breath. Negative for cough, hemoptysis, sputum production and wheezing.   Cardiovascular:  Negative for chest pain, palpitations, orthopnea and leg swelling.  Gastrointestinal:  Negative for abdominal pain, blood in stool, constipation, diarrhea, heartburn, melena, nausea and vomiting.  Genitourinary:  Negative for dysuria, frequency and urgency.  Musculoskeletal:  Negative for back pain and joint pain.  Skin: Negative.  Negative for itching and rash.  Neurological:  Negative for dizziness, tingling, focal weakness, weakness and headaches.  Endo/Heme/Allergies:  Does not bruise/bleed easily.  Psychiatric/Behavioral:  Negative for depression. The patient is not nervous/anxious and does not have insomnia.      MEDICAL HISTORY:  Past Medical History:  Diagnosis Date   Arthritis    Asbestos exposure    COPD (chronic obstructive pulmonary disease) ([place])    COPD, moderate ([place])     Diabetes mellitus without complication ([place])    Dyslipidemia    ED (erectile dysfunction)    Hypertension    IDA (iron deficiency anemia)    Obstructive sleep apnea    Renal disorder    Rheumatoid arthritis of multiple sites with negative rheumatoid factor ([place])     SURGICAL HISTORY: Past Surgical History:  Procedure Laterality Date   AMPUTATION TOE Right 04/20/2018   Procedure: AMPUTATION TOE MPJ/RT 3RD TOE;  Surgeon: Sharlotte Alamo, DPM;  Location: [facility];  Service: Podiatry;  Laterality: Right;   APPENDECTOMY     COLONOSCOPY     COLONOSCOPY WITH PROPOFOL N/A 07/20/2021   Procedure: COLONOSCOPY WITH PROPOFOL;  Surgeon: Lesly Rubenstein, MD;  Location: [facility];  Service: Endoscopy;  Laterality: N/A;  IDDM   CYSTECTOMY     ESOPHAGOGASTRODUODENOSCOPY (EGD) WITH PROPOFOL N/A 07/20/2021   Procedure: ESOPHAGOGASTRODUODENOSCOPY (EGD) WITH PROPOFOL;  Surgeon: Lesly Rubenstein, MD;  Location: [facility];  Service: Endoscopy;  Laterality: N/A;   HERNIA REPAIR     JOINT REPLACEMENT Right 2008   KNEE ARTHROSCOPY     MANDIBLE FRACTURE SURGERY     UPPER GI ENDOSCOPY      SOCIAL HISTORY: Social History   Socioeconomic History   Marital status: Married    Spouse name: Not on file   Number of children: Not on file   Years of education: Not on file   Highest education level: Not on file  Occupational History   Not on file  Tobacco Use   Smoking status: Former    Packs/day: 1.00    Types: Cigarettes    Quit date: 1989  Years since quitting: 34.6   Smokeless tobacco: Never   Tobacco comments:    Quit: 01/11/1987  Vaping Use   Vaping Use: Never used  Substance and Sexual Activity   Alcohol use: Yes    Comment: rarely last dose 61yr ago   Drug use: Never   Sexual activity: Yes  Other Topics Concern   Not on file  Social History Narrative   Not on file   Social Determinants of Health   Financial Resource Strain: Not on file  Food Insecurity: Not on file   Transportation Needs: Not on file  Physical Activity: Not on file  Stress: Not on file  Social Connections: Not on file  Intimate Partner Violence: Not on file    FAMILY HISTORY: History reviewed. No pertinent family history.  ALLERGIES:  has No Known Allergies.  MEDICATIONS:  Current Outpatient Medications  Medication Sig Dispense Refill   acetaminophen (TYLENOL) 650 MG CR tablet      acetaminophen-codeine (TYLENOL #4) 300-60 MG tablet Take by mouth.     albuterol (PROVENTIL HFA;VENTOLIN HFA) 108 (90 Base) MCG/ACT inhaler Inhale 1-2 puffs into the lungs every 6 (six) hours as needed for wheezing or shortness of breath.     aspirin EC 81 MG tablet Take 81 mg by mouth daily.     Calcium Carb-Cholecalciferol (CALCIUM 600+D3 PO) Take 1 tablet by mouth 2 (two) times daily.     clotrimazole-betamethasone (LOTRISONE) cream Apply 1 application topically daily.     diclofenac Sodium (VOLTAREN) 1 % GEL Apply topically 4 (four) times daily.     doxycycline (PERIOSTAT) 20 MG tablet Take 1 tablet 2x/day with food to treat rash on face     famotidine (PEPCID) 10 MG tablet Take 10 mg by mouth 2 (two) times daily as needed for heartburn or indigestion.     glipiZIDE (GLUCOTROL) 5 MG tablet Take 5-10 mg by mouth See admin instructions. Take 2 tablets (10 mg) by mouth in the morning & take 1 tablet (5 mg) by mouth in the evening.     Insulin Pen Needle (B-D ULTRAFINE III SHORT PEN) 31G X 8 MM MISC as directed USE AS DIRECTED     LANTUS SOLOSTAR 100 UNIT/ML Solostar Pen Inject 35 Units into the skin every evening.     linagliptin (TRADJENTA) 5 MG TABS tablet Take 5 mg by mouth daily.     metFORMIN (GLUCOPHAGE-XR) 500 MG 24 hr tablet Take 1,000 mg by mouth 2 (two) times daily.     methocarbamol (ROBAXIN) 500 MG tablet Take by mouth.     Multiple Vitamins-Minerals (MULTIVITAMIN ADULTS 50+) TABS Take by mouth.     nystatin (MYCOSTATIN/NYSTOP) powder Apply 1 g topically 2 (two) times daily as needed (skin  irritation in skin fold areas).     Omega-3 Fatty Acids (FISH OIL) 1000 MG CAPS Take 1,000 mg by mouth every evening.     predniSONE (DELTASONE) 5 MG tablet Take 5 mg by mouth daily.     ramipril (ALTACE) 10 MG capsule Take 10 mg by mouth 2 (two) times daily.      ranitidine (ZANTAC) 150 MG capsule Take 150 mg by mouth 2 (two) times daily.     sildenafil (REVATIO) 20 MG tablet May take up to 5 tabs 1/2 hr before sexual intercourse     simvastatin (ZOCOR) 80 MG tablet Take 40 mg by mouth every evening.      tamsulosin (FLOMAX) 0.4 MG CAPS capsule Take 0.4 mg by mouth daily.  XELJANZ 5 MG TABS Take 5 mg by mouth 2 (two) times daily.     ferrous sulfate 325 (65 FE) MG tablet Take 1 tablet by mouth daily with breakfast.     pioglitazone (ACTOS) 15 MG tablet Take by mouth.     No current facility-administered medications for this visit.      Marland Kitchen  PHYSICAL EXAMINATION:   Vitals:   08/30/21 1317  BP: 124/65  Pulse: 99  Temp: (!) 97.4 F (36.3 C)  SpO2: 95%   Filed Weights   08/30/21 1317  Weight: 201 lb 9.6 oz (91.4 kg)    Physical Exam Vitals and nursing note reviewed.  Constitutional:      Comments:    HENT:     Head: Normocephalic and atraumatic.     Mouth/Throat:     Pharynx: Oropharynx is clear.  Eyes:     Extraocular Movements: Extraocular movements intact.     Pupils: Pupils are equal, round, and reactive to light.  Cardiovascular:     Rate and Rhythm: Normal rate and regular rhythm.  Pulmonary:     Comments: Decreased breath sounds bilaterally.  Abdominal:     Palpations: Abdomen is soft.  Musculoskeletal:        General: Normal range of motion.     Cervical back: Normal range of motion.     Comments: Joint deformity suggestive of rheumatoid arthritis.  Skin:    General: Skin is warm.  Neurological:     General: No focal deficit present.     Mental Status: He is alert and oriented to person, place, and time.  Psychiatric:        Behavior: Behavior  normal.        Judgment: Judgment normal.      LABORATORY DATA:  I have reviewed the data as listed Lab Results  Component Value Date   WBC 6.2 08/30/2021   HGB 13.8 08/30/2021   HCT 42.3 08/30/2021   MCV 88.1 08/30/2021   PLT 198 08/30/2021   Recent Labs    11/03/20 1254 03/02/21 1249 08/30/21 1246  NA 136 133* 136  K 4.4 4.3 4.4  CL 103 99 102  CO2 '25 25 26  '$ GLUCOSE 193* 268* 279*  BUN '14 16 13  '$ CREATININE 0.75 0.76 0.70  CALCIUM 9.0 9.1 9.2  GFRNONAA >60 >60 >60    RADIOGRAPHIC STUDIES: I have personally reviewed the radiological images as listed and agreed with the findings in the report. No results found.  ASSESSMENT & PLAN:   Iron deficiency #Iron deficiency anemia-unclear etiology-July 2022 hemoglobin around 10; ferritin-6.  Symptomatic with fatigue.  S/p Venofer x4.   # hemoglobin today is 13.8.  Hold IV Venofer today.  Continue oral iron.   #Etiology: Unclear- JULY 2023- [Dr.Locklear]; colonoscopy normal limits no further colo; EGD- H.Pylori.  Monitor for now  # Elevated BG- sec to DM/steroids-defer to PCP/rheumatology  # Severe RA-Dr.Patel- STABLE.    # DISPOSITION: # NO venofer today # follow up in 8 months for MD assessment and labs- cbc/cmp;iron studies/ferrtin- possible- Venofer- Dr.B.    All questions were answered. The patient knows to call the clinic with any problems, questions or concerns.    Cammie Sickle, MD 08/30/2021 2:05 PM

## 2021-08-30 NOTE — Assessment & Plan Note (Addendum)
#  Iron deficiency anemia-unclear etiology-July 2022 hemoglobin around 10; ferritin-6.  Symptomatic with fatigue.  S/p Venofer x4.   # hemoglobin today is 13.8.  Hold IV Venofer today.  Continue oral iron.   #Etiology: Unclear- JULY 2023- [Dr.Locklear]; colonoscopy normal limits no further colo; EGD- H.Pylori.  Monitor for now  # Elevated BG- sec to DM/steroids-defer to PCP/rheumatology  # Severe RA-Dr.Patel- STABLE.   # DISPOSITION: # NO venofer today # follow up in 8 months for MD assessment and labs- cbc/cmp;iron studies/ferrtin- possible- Venofer- Dr.B.

## 2021-12-13 ENCOUNTER — Ambulatory Visit
Admission: EM | Admit: 2021-12-13 | Discharge: 2021-12-13 | Disposition: A | Discharge status: Home / Self Care | Payer: Medicare Other | Attending: Internal Medicine | Admitting: Internal Medicine

## 2021-12-13 ENCOUNTER — Ambulatory Visit (INDEPENDENT_AMBULATORY_CARE_PROVIDER_SITE_OTHER): Payer: Medicare Other

## 2021-12-13 DIAGNOSIS — R059 Cough, unspecified: Secondary | ICD-10-CM | POA: Diagnosis not present

## 2021-12-13 DIAGNOSIS — M940 Chondrocostal junction syndrome [Tietze]: Secondary | ICD-10-CM | POA: Diagnosis present

## 2021-12-13 DIAGNOSIS — Z20822 Contact with and (suspected) exposure to covid-19: Secondary | ICD-10-CM | POA: Diagnosis present

## 2021-12-13 DIAGNOSIS — R6889 Other general symptoms and signs: Secondary | ICD-10-CM | POA: Insufficient documentation

## 2021-12-13 DIAGNOSIS — R0989 Other specified symptoms and signs involving the circulatory and respiratory systems: Secondary | ICD-10-CM

## 2021-12-13 LAB — RESP PANEL BY RT-PCR (RSV, FLU A&B, COVID)  RVPGX2
Influenza A by PCR: NEGATIVE
Influenza B by PCR: NEGATIVE
Resp Syncytial Virus by PCR: NEGATIVE
SARS Coronavirus 2 by RT PCR: NEGATIVE

## 2021-12-13 MED ORDER — MOLNUPIRAVIR EUA 200MG CAPSULE: 4.0000 | ORAL_CAPSULE | Freq: Two times a day (BID) | ORAL | 0 refills | Status: AC

## 2021-12-13 NOTE — Discharge Instructions (Addendum)
   Stay quarantined for 5 days from the onset of symptoms, and they wear a mask for 5 more days after that when you go out in the public. Due to your wife having Covid and you having similar and some worse symptoms, I will go ahead and place you on the antiviral medication to help you get better.  You may take Advil or motrin for the rib pain.  Your chest xray does not show pneumonia. Here is the report, please follow up with your primary care provider regarding asbestos noted in the chest xray Narrative & Impression  CLINICAL DATA:  Left lower lobe crackles.  Cough and runny nose.   EXAM: CHEST - 2 VIEW   COMPARISON:  None Available.   FINDINGS: Two views of the chest demonstrate densities overlying the mid chest bilaterally. These most likely represent pleural calcifications. Evidence for pleural calcifications along the hemidiaphragms. Mild right apical scarring. No significant airspace disease or lung consolidation. Heart size is within normal limits. Atherosclerotic calcifications at the aortic arch. No large pleural effusions. Mild compression deformities in the mid thoracic spine are likely chronic.   IMPRESSION: 1. No acute cardiopulmonary disease. 2. Bilateral pleural calcifications. 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     Electronically Signed   By: Markus Daft M.D.   On: 12/13/2021 10:59

## 2021-12-13 NOTE — ED Triage Notes (Signed)
Pt reports he has a cough, runny nose, feels fatigue, low appetite. Left side pain for a week. took mucinex and tussin  hbp and tessalon pearls that helped slightly.

## 2021-12-13 NOTE — ED Provider Notes (Signed)
[facility]    CSN: [id number] Arrival date & time: 12/13/21  0901      History   Chief Complaint No chief complaint on file.   HPI Samuel Mendoza is a 81 y.o. male who presents with his wife due to having cough, rhinitis, fatigued and low appetite x 2 days. His wife got sick 3-4 days ago and has covid. He took Mucinex and Tussin and [employer] and helped a little, but he is still fatigued with decreased appetite. Has hd 3 covid injections. States his L lateral ribs are sore.     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 11/24/2016   Moderate COPD (chronic obstructive pulmonary disease) ([place]) 11/24/2016   Asbestos exposure 05/26/2016   Benign essential hypertension 07/23/2010   Dyslipidemia 07/23/2010   ED (erectile dysfunction) of organic origin 07/23/2010   Obesity 07/23/2010   Rheumatoid arthritis of multiple sites with negative rheumatoid factor ([place]) 07/23/2010   Type 2 diabetes mellitus without complication, with long-term current use of insulin ([place]) 07/23/2010    Past Surgical History:  Procedure Laterality Date   AMPUTATION TOE Right 04/20/2018   Procedure: AMPUTATION TOE MPJ/RT 3RD TOE;  Surgeon: Sharlotte Alamo, DPM;  Location: [facility];  Service: Podiatry;  Laterality: Right;   APPENDECTOMY     COLONOSCOPY     COLONOSCOPY WITH PROPOFOL N/A 07/20/2021   Procedure:  COLONOSCOPY WITH PROPOFOL;  Surgeon: Lesly Rubenstein, MD;  Location: [facility];  Service: Endoscopy;  Laterality: N/A;  IDDM   CYSTECTOMY     ESOPHAGOGASTRODUODENOSCOPY (EGD) WITH PROPOFOL N/A 07/20/2021   Procedure: ESOPHAGOGASTRODUODENOSCOPY (EGD) WITH PROPOFOL;  Surgeon: Lesly Rubenstein, MD;  Location: [facility];  Service: Endoscopy;  Laterality: N/A;   HERNIA REPAIR     JOINT REPLACEMENT Right 2008   KNEE ARTHROSCOPY     MANDIBLE FRACTURE SURGERY     UPPER GI ENDOSCOPY         Home Medications    Prior to Admission medications   Medication Sig Start Date End Date Taking? Authorizing Provider  molnupiravir EUA (LAGEVRIO) 200 mg CAPS capsule Take 4 capsules (800 mg total) by mouth 2 (two) times daily for 5 days. 12/13/21 12/18/21 Yes Rodriguez-Southworth, Sunday Spillers, PA-C  acetaminophen (TYLENOL) 650 MG CR tablet  09/03/19   [provider]  acetaminophen-codeine (TYLENOL #4) 300-60 MG tablet Take by mouth.    [provider]  albuterol (PROVENTIL HFA;VENTOLIN HFA) 108 (90 Base) MCG/ACT inhaler Inhale 1-2 puffs into the lungs every 6 (six) hours as needed for wheezing or shortness of breath.    [provider]  aspirin EC 81 MG tablet Take 81 mg by mouth daily.    [provider]  Calcium Carb-Cholecalciferol (CALCIUM 600+D3 PO) Take 1 tablet by mouth 2 (two) times daily.    [provider]  clotrimazole-betamethasone Donalynn Furlong)  cream Apply 1 application topically daily. 02/07/18   [provider]  diclofenac Sodium (VOLTAREN) 1 % GEL Apply topically 4 (four) times daily.    [provider]  doxycycline (PERIOSTAT) 20 MG tablet Take 1 tablet 2x/day with food to treat rash on face 06/04/20   [provider]  famotidine (PEPCID) 10 MG tablet Take 10 mg by mouth 2 (two) times daily as needed for heartburn or indigestion.    [provider]  ferrous sulfate 325 (65 FE) MG tablet Take 1 tablet by mouth  daily with breakfast. 03/17/20 03/17/21  [provider]  glipiZIDE (GLUCOTROL) 5 MG tablet Take 5-10 mg by mouth See admin instructions. Take 2 tablets (10 mg) by mouth in the morning & take 1 tablet (5 mg) by mouth in the evening.    [provider]  Insulin Pen Needle (B-D ULTRAFINE III SHORT PEN) 31G X 8 MM MISC as directed USE AS DIRECTED 04/27/20   [provider]  LANTUS SOLOSTAR 100 UNIT/ML Solostar Pen Inject 35 Units into the skin every evening. 01/22/18   [provider]  linagliptin (TRADJENTA) 5 MG TABS tablet Take 5 mg by mouth daily.    [provider]  metFORMIN (GLUCOPHAGE-XR) 500 MG 24 hr tablet Take 1,000 mg by mouth 2 (two) times daily. 02/07/18   [provider]  methocarbamol (ROBAXIN) 500 MG tablet Take by mouth.    [provider]  Multiple Vitamins-Minerals (MULTIVITAMIN ADULTS 50+) TABS Take by mouth.    [provider]  nystatin (MYCOSTATIN/NYSTOP) powder Apply 1 g topically 2 (two) times daily as needed (skin irritation in skin fold areas).    [provider]  Omega-3 Fatty Acids (FISH OIL) 1000 MG CAPS Take 1,000 mg by mouth every evening.    [provider]  pioglitazone (ACTOS) 15 MG tablet Take by mouth. 10/08/19 11/03/20  [provider]  predniSONE (DELTASONE) 5 MG tablet Take 5 mg by mouth daily. 02/13/18   [provider]  ramipril (ALTACE) 10 MG capsule Take 10 mg by mouth 2 (two) times daily.     [provider]  ranitidine (ZANTAC) 150 MG capsule Take 150 mg by mouth 2 (two) times daily.    [provider]  sildenafil (REVATIO) 20 MG tablet May take up to 5 tabs 1/2 hr before sexual intercourse 07/22/19   [provider]  simvastatin (ZOCOR) 80 MG tablet Take 40 mg by mouth every evening.     [provider]  tamsulosin (FLOMAX) 0.4 MG CAPS capsule Take 0.4 mg by mouth daily.    [provider]  XELJANZ 5 MG TABS Take 5  mg by mouth 2 (two) times daily.    [provider]    Family History History reviewed. No pertinent family history.  Social History Social History   Tobacco Use   Smoking status: Former    Packs/day: 1.00    Types: Cigarettes    Quit date: 1989    Years since quitting: 34.9   Smokeless tobacco: Never   Tobacco comments:    Quit: 01/11/1987  Vaping Use   Vaping Use: Never used  Substance Use Topics   Alcohol use: Yes    Comment: rarely last dose 58yr ago   Drug use: Never     Allergies   Patient has no known allergies.   Review of Systems Review of Systems  Constitutional:  Positive for activity change, appetite change and fatigue. Negative for chills and  fever.  HENT:  Negative for congestion, ear discharge, ear pain, postnasal drip, rhinorrhea and sore throat.   Eyes:  Negative for discharge.  Respiratory:  Positive for cough. Negative for chest tightness and shortness of breath.        + chest wall pain  Cardiovascular:  Negative for chest pain.  Gastrointestinal:  Negative for diarrhea, nausea and vomiting.     Physical Exam Triage Vital Signs ED Triage Vitals  Enc Vitals Group     BP --      Pulse Rate 12/13/21 0926 77     Resp 12/13/21 0926 18     Temp 12/13/21 0926 97.9 F (36.6 C)     Temp Source 12/13/21 0926 Oral     SpO2 12/13/21 0926 98 %     Weight --      Height --      Head Circumference --      Peak Flow --      Pain Score 12/13/21 0931 6     Pain Loc --      Pain Edu? --      Excl. in [place]? --    No data found.  Updated Vital Signs Pulse 77   Temp 97.9 F (36.6 C) (Oral)   Resp 18   SpO2 98%   Visual Acuity Right Eye Distance:   Left Eye Distance:   Bilateral Distance:    Right Eye Near:   Left Eye Near:    Bilateral Near:      Physical Exam Vitals signs and nursing note reviewed.  Constitutional:      General: he is not in acute distress.    Appearance: Normal appearance. He is not ill-appearing,  toxic-appearing or diaphoretic.  HENT:     Head: Normocephalic.     Right Ear: Tympanic membrane, ear canal and external ear normal.     Left Ear: Tympanic membrane, ear canal and external ear normal.     Nose: Nose normal.     Mouth/Throat: clear    Mouth: Mucous membranes are moist.  Eyes:     General: No scleral icterus.       Right eye: No discharge.        Left eye: No discharge.     Conjunctiva/sclera: Conjunctivae normal.  Neck:     Musculoskeletal: Neck supple. No neck rigidity.  Cardiovascular:     Rate and Rhythm: Normal rate and regular rhythm.     Heart sounds: No murmur.  Pulmonary:     Effort: Pulmonary effort is normal.     Breath sounds: Normal breath sounds.  Musculoskeletal: Normal range of motion. Has local tenderness on L lateral ribs with no rash or crepitations.  Lymphadenopathy:     Cervical: No cervical adenopathy.  Skin:    General: Skin is warm and dry.     Coloration: Skin is not jaundiced.     Findings: No rash.  Neurological:     Mental Status: She is alert and oriented to person, place, and time.     Gait: Gait normal.  Psychiatric:        Mood and Affect: Mood normal.        Behavior: Behavior normal.        Thought Content: Thought content normal.        Judgment: Judgment normal.    UC Treatments / Results  Labs (all labs ordered are listed, but only abnormal results are displayed) Labs Reviewed  RESP PANEL BY RT-PCR (  RSV, FLU A&B, COVID)  [id number]  Negative Flu and Covid  EKG   Radiology DG Chest 2 View  Result Date: 12/13/2021 CLINICAL DATA:  Left lower lobe crackles.  Cough and runny nose. EXAM: CHEST - 2 VIEW COMPARISON:  None Available. FINDINGS: Two views of the chest demonstrate densities overlying the mid chest bilaterally. These most likely represent pleural calcifications. Evidence for pleural calcifications along the hemidiaphragms. Mild right apical scarring. No significant airspace disease or lung consolidation. Heart size  is within normal limits. Atherosclerotic calcifications at the aortic arch. No large pleural effusions. Mild compression deformities in the mid thoracic spine are likely chronic. IMPRESSION: 1. No acute cardiopulmonary disease. 2. Bilateral pleural calcifications. 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 Electronically Signed   By: Markus Daft M.D.   On: 12/13/2021 10:59    Procedures Procedures (including critical care time)  Medications Ordered in UC Medications - No data to display  Initial Impression / Assessment and Plan / UC Course  I have reviewed the triage vital signs and the nursing notes.  Pertinent labs & imaging results that were available during my care of the patient were reviewed by me and considered in my medical decision making (see chart for details).  Covid exposure Covid symptoms  I believe his covid test is negative due to being to early to detect it, so since his wife has covid and they have been in close contact I will go ahead and treat him for covid and I placed him on Molnupiravir as noted.  See instructions  Final Clinical Impressions(s) / UC Diagnoses   Final diagnoses:  Close exposure to COVID-19 virus  Flu-like symptoms  Costochondritis     Discharge Instructions        Stay quarantined for 5 days from the onset of symptoms, and they wear a mask for 5 more days after that when you go out in the public. Due to your wife having Covid and you having similar and some worse symptoms, I will go ahead and place you on the antiviral medication to help you get better.  You may take Advil or motrin for the rib pain.  Your chest xray does not show pneumonia. Here is the report, please follow up with your primary care provider regarding asbestos noted in the chest xray Narrative & Impression  CLINICAL DATA:  Left lower lobe crackles.  Cough and runny nose.   EXAM: CHEST - 2 VIEW   COMPARISON:  None Available.   FINDINGS: Two views  of the chest demonstrate densities overlying the mid chest bilaterally. These most likely represent pleural calcifications. Evidence for pleural calcifications along the hemidiaphragms. Mild right apical scarring. No significant airspace disease or lung consolidation. Heart size is within normal limits. Atherosclerotic calcifications at the aortic arch. No large pleural effusions. Mild compression deformities in the mid thoracic spine are likely chronic.   IMPRESSION: 1. No acute cardiopulmonary disease. 2. Bilateral pleural calcifications. 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     Electronically Signed   By: Markus Daft M.D.   On: 12/13/2021 10:59       ED Prescriptions     Medication Sig Dispense Auth. Provider   molnupiravir EUA (LAGEVRIO) 200 mg CAPS capsule Take 4 capsules (800 mg total) by mouth 2 (two) times daily for 5 days. 40 capsule Rodriguez-Southworth, Sunday Spillers, PA-C      PDMP not reviewed this encounter.   Shelby Mattocks, PA-C 12/13/21 1120

## 2022-05-04 ENCOUNTER — Other Ambulatory Visit: Payer: Medicare Other

## 2022-05-04 ENCOUNTER — Ambulatory Visit: Payer: Medicare Other

## 2022-05-04 ENCOUNTER — Ambulatory Visit: Payer: Medicare Other | Admitting: Internal Medicine

## 2022-07-11 DEATH — deceased
